# Patient Record
Sex: Male | Born: 1985 | Race: Black or African American | Hispanic: No | Marital: Single | Smoking: Current every day smoker
Health system: Southern US, Community
[De-identification: ages and names within clinical notes are randomized; demographics above are authoritative.]

## PROBLEM LIST (undated history)

## (undated) DIAGNOSIS — I10 Essential (primary) hypertension: Secondary | ICD-10-CM

---
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
## 2005-02-12 ENCOUNTER — Emergency Department (HOSPITAL_COMMUNITY): Admission: EM | Admit: 2005-02-12 | Discharge: 2005-02-13 | Payer: Self-pay | Admitting: *Deleted

## 2005-07-23 ENCOUNTER — Ambulatory Visit: Admission: RE | Admit: 2005-07-23 | Discharge: 2005-07-23 | Payer: Self-pay | Admitting: Pediatrics

## 2005-08-18 ENCOUNTER — Ambulatory Visit: Payer: Self-pay | Admitting: Pulmonary Disease

## 2005-11-11 ENCOUNTER — Emergency Department (HOSPITAL_COMMUNITY): Admission: EM | Admit: 2005-11-11 | Discharge: 2005-11-11 | Payer: Self-pay | Admitting: Emergency Medicine

## 2006-04-14 ENCOUNTER — Emergency Department (HOSPITAL_COMMUNITY): Admission: EM | Admit: 2006-04-14 | Discharge: 2006-04-14 | Payer: Self-pay | Admitting: Emergency Medicine

## 2007-09-04 ENCOUNTER — Emergency Department (HOSPITAL_COMMUNITY): Admission: EM | Admit: 2007-09-04 | Discharge: 2007-09-04 | Payer: Self-pay | Admitting: Emergency Medicine

## 2008-10-13 ENCOUNTER — Ambulatory Visit (HOSPITAL_COMMUNITY): Payer: Self-pay | Admitting: Psychiatry

## 2008-11-05 ENCOUNTER — Emergency Department (HOSPITAL_COMMUNITY): Admission: EM | Admit: 2008-11-05 | Discharge: 2008-11-05 | Payer: Self-pay | Admitting: Emergency Medicine

## 2008-11-17 ENCOUNTER — Ambulatory Visit (HOSPITAL_COMMUNITY): Payer: Self-pay | Admitting: Psychiatry

## 2009-01-12 ENCOUNTER — Ambulatory Visit (HOSPITAL_COMMUNITY): Payer: Self-pay | Admitting: Psychiatry

## 2009-02-09 ENCOUNTER — Ambulatory Visit (HOSPITAL_COMMUNITY): Payer: Self-pay | Admitting: Psychiatry

## 2009-03-30 ENCOUNTER — Ambulatory Visit (HOSPITAL_COMMUNITY): Payer: Self-pay | Admitting: Psychiatry

## 2009-05-27 ENCOUNTER — Ambulatory Visit (HOSPITAL_COMMUNITY): Payer: Self-pay | Admitting: Psychiatry

## 2009-05-27 ENCOUNTER — Emergency Department (HOSPITAL_COMMUNITY): Admission: EM | Admit: 2009-05-27 | Discharge: 2009-05-27 | Payer: Self-pay | Admitting: Emergency Medicine

## 2009-08-17 ENCOUNTER — Ambulatory Visit (HOSPITAL_COMMUNITY): Payer: Self-pay | Admitting: Psychiatry

## 2009-10-14 ENCOUNTER — Ambulatory Visit (HOSPITAL_COMMUNITY): Payer: Self-pay | Admitting: Psychiatry

## 2009-10-19 ENCOUNTER — Ambulatory Visit (HOSPITAL_COMMUNITY): Payer: Self-pay | Admitting: Psychology

## 2009-12-14 ENCOUNTER — Ambulatory Visit (HOSPITAL_COMMUNITY): Payer: Self-pay | Admitting: Psychiatry

## 2010-07-08 ENCOUNTER — Emergency Department (HOSPITAL_COMMUNITY): Admission: EM | Admit: 2010-07-08 | Discharge: 2010-07-08 | Payer: Self-pay | Admitting: Emergency Medicine

## 2010-10-25 LAB — BASIC METABOLIC PANEL
BUN: 16 mg/dL (ref 6–23)
CO2: 27 mEq/L (ref 19–32)
GFR calc Af Amer: >60 mL/min (ref >60)
GFR calc non Af Amer: >60 mL/min (ref >60)

## 2010-10-25 LAB — DIFFERENTIAL
Eosinophils Absolute: 0 10*3/uL (ref 0.0–0.7)
Lymphs Abs: 1.7 10*3/uL (ref 0.7–4.0)
Monocytes Absolute: 0.5 10*3/uL (ref 0.1–1.0)
Neutro Abs: 4.2 10*3/uL (ref 1.7–7.7)
Neutrophils Relative %: 66 % (ref 43–77)

## 2010-10-25 LAB — RAPID URINE DRUG SCREEN, HOSP PERFORMED
Barbiturates: NOT DETECTED
Benzodiazepines: NOT DETECTED
Cocaine: NOT DETECTED
Opiates: NOT DETECTED

## 2010-10-25 LAB — URINALYSIS, ROUTINE W REFLEX MICROSCOPIC
Bilirubin Urine: NEGATIVE
Glucose, UA: NEGATIVE mg/dL
Urobilinogen, UA: 0.2 mg/dL (ref 0.0–1.0)

## 2010-10-25 LAB — CBC
MCH: 32.5 pg (ref 26.0–34.0)
MCV: 92.7 fL (ref 78.0–100.0)
RBC: 4.55 MIL/uL (ref 4.22–5.81)
WBC: 6.4 10*3/uL (ref 4.0–10.5)

## 2010-10-25 LAB — URINE MICROSCOPIC-ADD ON

## 2010-10-25 LAB — ETHANOL: Alcohol, Ethyl (B): <5 mg/dL (ref 0–10)

## 2010-11-17 LAB — DIFFERENTIAL
Basophils Absolute: 0 10*3/uL (ref 0.0–0.1)
Basophils Relative: 0 % (ref 0–1)
Eosinophils Absolute: 0.1 10*3/uL (ref 0.0–0.7)
Eosinophils Relative: 2 % (ref 0–5)
Lymphocytes Relative: 41 % (ref 12–46)
Lymphs Abs: 2.1 10*3/uL (ref 0.7–4.0)
Monocytes Absolute: 0.5 10*3/uL (ref 0.1–1.0)
Monocytes Relative: 10 % (ref 3–12)
Neutro Abs: 2.4 10*3/uL (ref 1.7–7.7)
Neutrophils Relative %: 46 % (ref 43–77)

## 2010-11-17 LAB — CBC
HCT: 44.6 % (ref 39.0–52.0)
MCHC: 34.5 g/dL (ref 30.0–36.0)
MCV: 100.3 fL — ABNORMAL HIGH (ref 78.0–100.0)
WBC: 5.1 10*3/uL (ref 4.0–10.5)

## 2010-11-17 LAB — BASIC METABOLIC PANEL
Calcium: 8.9 mg/dL (ref 8.4–10.5)
Creatinine, Ser: 1.12 mg/dL (ref 0.4–1.5)

## 2010-12-30 NOTE — Procedures (Signed)
Robert Snyder, Robert Snyder               ACCOUNT NO.:  [id number]   MEDICAL RECORD NO.:  [id number]          PATIENT TYPE:  OUT   LOCATION:  SLEEP LAB                     FACILITY:  [facility]   PHYSICIAN:  Marcelyn Bruins, M.D. [facility] DATE OF BIRTH:  05/08/1986   DATE OF STUDY:  07/23/2005                              NOCTURNAL POLYSOMNOGRAM   REFERRING PHYSICIAN:  Dr. Vivia Ewing.   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   EPWORTH SLEEPINESS SCORE:  8   SLEEP ARCHITECTURE:  The patient had a total sleep time of 451 minutes with  decreased slow wave sleep and borderline quantities of REM.  Sleep onset  latency was normal and REM onset was at the upper limits of normal.  Sleep  efficiency was excellent at 94%.   RESPIRATORY DATA:  The patient was found to have 21 hypopneas and 5 apneas  for a respiratory disturbance index of 4 per hour.  The events were not  positional, but clearly were more prominent during REM.  The patient was  noted to have mild-to-moderate snoring with moderate numbers of nonspecific  arousals.   OXYGEN DATA:  The patient had O2 desaturation as low as 90% with a few of  his obstructive events.   CARDIAC DATA:  No clinically significant cardiac arrhythmias.   MOVEMENT/PARASOMNIA:  The patient was found to have small numbers of leg  jerks with no clinically significant sleep disruption.  The patient was  noted to have very mild episodes of bruxism.   IMPRESSION/RECOMMENDATION:  1.  Small numbers of obstructive events which do not meet the respiratory      disturbance index criteria for the obstructive sleep apnea syndrome.      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  This is a pre-sleep-apnea condition which can      result in sleep disruption.  Considerations for treatment would include      weight  loss if applicable, oral appliance, upper airway surgery, and on      occasion, continuous positive airway pressure.  2.  The patient complains of excessive daytime sleepiness; however, has a      fairly low Epworth score.  There is nothing on the current sleep study      which stands out as a prime suspect for his symptomatology.  Clinical      correlation is suggested to see whether the patient may have a daytime      sleep disorder such as narcolepsy were idiopathic hypersomnia.                                            ______________________________  Marcelyn Bruins, M.D. [facility]  Diplomate, American Board of Sleep  Medicine     KC/MEDQ  D:  08/18/2005 10:14:55  T:  08/18/2005 11:51:54  Job:  [id number]

## 2011-09-29 ENCOUNTER — Emergency Department (HOSPITAL_COMMUNITY)
Admission: EM | Admit: 2011-09-29 | Discharge: 2011-09-29 | Disposition: A | Discharge status: Home / Self Care | Payer: Self-pay | Attending: Emergency Medicine | Admitting: Emergency Medicine

## 2011-09-29 ENCOUNTER — Encounter (HOSPITAL_COMMUNITY): Payer: Self-pay | Admitting: *Deleted

## 2011-09-29 DIAGNOSIS — F17200 Nicotine dependence, unspecified, uncomplicated: Secondary | ICD-10-CM | POA: Insufficient documentation

## 2011-09-29 DIAGNOSIS — B86 Scabies: Secondary | ICD-10-CM | POA: Insufficient documentation

## 2011-09-29 MED ORDER — PERMETHRIN 5 % EX CREA: TOPICAL_CREAM | CUTANEOUS | Status: AC

## 2011-09-29 NOTE — ED Notes (Signed)
"  bumps keep poppig up on my hand and my cousin was dx with scabies when he come up here"

## 2011-09-29 NOTE — Discharge Instructions (Signed)
Scabies Scabies are small bugs (mites) that burrow under the skin and cause red bumps and severe itching. These bugs can only be seen with a microscope. Scabies are highly contagious. They can spread easily from person to person by direct contact. They are also spread through sharing clothing or linens that have the scabies mites living in them. It is not unusual for an entire family to become infected through shared towels, clothing, or bedding.  HOME CARE INSTRUCTIONS   Your caregiver may prescribe a cream or lotion to kill the mites. If this cream is prescribed; massage the cream into the entire area of the body from the neck to the bottom of both feet. Also massage the cream into the scalp and face if your child is less than 25 year old. Avoid the eyes and mouth.   Leave the cream on for 8 to12 hours. Do not wash your hands after application. Your child should bathe or shower after the 8 to 12 hour application period. Sometimes it is helpful to apply the cream to your child at right before bedtime.   One treatment is usually effective and will eliminate approximately 95% of infestations. For severe cases, your caregiver may decide to repeat the treatment in 1 week. Everyone in your household should be treated with one application of the cream.   New rashes or burrows should not appear after successful treatment within 24 to 48 hours; however the itching and rash may last for 2 to 4 weeks after successful treatment. If your symptoms persist longer than this, see your caregiver.   Your caregiver also may prescribe a medication to help with the itching or to help the rash go away more quickly.   Scabies can live on clothing or linens for up to 3 days. Your entire child's recently used clothing, towels, stuffed toys, and bed linens should be washed in hot water and then dried in a dryer for at least 20 minutes on high heat. Items that cannot be washed should be enclosed in a plastic bag for at least 3  days.   To help relieve itching, bathe your child in a cool bath or apply cool washcloths to the affected areas.   Your child may return to school after treatment with the prescribed cream.  SEEK MEDICAL CARE IF:   The itching persists longer than 4 weeks after treatment.   The rash spreads or becomes infected (the area has red blisters or yellow-tan crust).  Document Released: 07/31/2005 Document Revised: 04/12/2011 Document Reviewed: 12/09/2008 [facility] Patient Information 2012 [place], [place].  Use the cream as directed.  Repeat in 2 weeks if needed.

## 2011-09-29 NOTE — ED Provider Notes (Signed)
History     CSN: [id number]  Arrival date & time 09/29/11  1030   First MD Initiated Contact with Patient 09/29/11 1052      Chief Complaint  Patient presents with  . Rash    (Consider location/radiation/quality/duration/timing/severity/associated sxs/prior treatment) HPI Comments: Started on hands and wrists, spread to forearms and now on torso.  Girlfriend and another friend living in the same house also have similar sxs.  Patient is a 26 y.o. male presenting with rash. The history is provided by the patient. No language interpreter was used.  Rash  This is a new problem. Episode onset: ~ 2 weeks. The problem has been gradually worsening. The problem is associated with an unknown factor. Associated symptoms include itching. He has tried nothing for the symptoms.    History reviewed. No pertinent past medical history.  History reviewed. No pertinent past surgical history.  No family history on file.  History  Substance Use Topics  . Smoking status: Current Everyday Smoker  . Smokeless tobacco: Not on file  . Alcohol Use: Yes      Review of Systems  Skin: Positive for itching and rash.    Allergies  Penicillins  Home Medications  No current outpatient prescriptions on file.  BP 166/104  Pulse 78  Temp 98.5 F (36.9 C)  Resp 16  SpO2 100%  Physical Exam  Nursing note and vitals reviewed. Constitutional: He is oriented to person, place, and time. He appears well-developed and well-nourished.  HENT:  Head: Normocephalic and atraumatic.  Eyes: EOM are normal.  Neck: Normal range of motion.  Cardiovascular: Normal rate, regular rhythm, normal heart sounds and intact distal pulses.   Pulmonary/Chest: Effort normal and breath sounds normal. No respiratory distress.  Abdominal: Soft. He exhibits no distension. There is no tenderness.  Musculoskeletal: Normal range of motion.       Arms: Neurological: He is alert and oriented to person, place, and time.    Skin: Skin is warm and dry. Rash noted.  Psychiatric: He has a normal mood and affect. Judgment normal.    ED Course  Procedures (including critical care time)  Labs Reviewed - No data to display No results found.   No diagnosis found.    MDM          Worthy Rancher, PA 09/29/11 [phone]

## 2011-09-29 NOTE — ED Notes (Signed)
C/o rash.

## 2011-09-30 NOTE — ED Provider Notes (Signed)
Evaluation and management procedures were performed by the PA/NP under my supervision/collaboration.   Johanny Segers D Vicy Medico, MD 09/30/11 0820 

## 2012-02-14 ENCOUNTER — Emergency Department (HOSPITAL_COMMUNITY)
Admission: EM | Admit: 2012-02-14 | Discharge: 2012-02-14 | Disposition: A | Discharge status: Home / Self Care | Payer: Self-pay | Attending: Emergency Medicine | Admitting: Emergency Medicine

## 2012-02-14 ENCOUNTER — Emergency Department (HOSPITAL_COMMUNITY): Payer: Self-pay

## 2012-02-14 ENCOUNTER — Encounter (HOSPITAL_COMMUNITY): Payer: Self-pay

## 2012-02-14 DIAGNOSIS — R1013 Epigastric pain: Secondary | ICD-10-CM | POA: Insufficient documentation

## 2012-02-14 DIAGNOSIS — J40 Bronchitis, not specified as acute or chronic: Secondary | ICD-10-CM

## 2012-02-14 DIAGNOSIS — R112 Nausea with vomiting, unspecified: Secondary | ICD-10-CM | POA: Insufficient documentation

## 2012-02-14 DIAGNOSIS — R0602 Shortness of breath: Secondary | ICD-10-CM | POA: Insufficient documentation

## 2012-02-14 DIAGNOSIS — R51 Headache: Secondary | ICD-10-CM | POA: Insufficient documentation

## 2012-02-14 DIAGNOSIS — F17200 Nicotine dependence, unspecified, uncomplicated: Secondary | ICD-10-CM | POA: Insufficient documentation

## 2012-02-14 LAB — CBC WITH DIFFERENTIAL/PLATELET
Eosinophils Absolute: 0 10*3/uL (ref 0.0–0.7)
Eosinophils Relative: 0 % (ref 0–5)
Hemoglobin: 15.3 g/dL (ref 13.0–17.0)
Lymphocytes Relative: 14 % (ref 12–46)
MCH: 32.8 pg (ref 26.0–34.0)
MCHC: 34.9 g/dL (ref 30.0–36.0)
Monocytes Absolute: 0.7 10*3/uL (ref 0.1–1.0)
Monocytes Relative: 7 % (ref 3–12)
Neutro Abs: 8 10*3/uL — ABNORMAL HIGH (ref 1.7–7.7)
Platelets: 144 10*3/uL — ABNORMAL LOW (ref 150–400)

## 2012-02-14 LAB — BASIC METABOLIC PANEL
Calcium: 9.7 mg/dL (ref 8.4–10.5)
GFR calc non Af Amer: >90 mL/min (ref >90)
Glucose, Bld: 106 mg/dL — ABNORMAL HIGH (ref 70–99)

## 2012-02-14 MED ORDER — ONDANSETRON 8 MG PO TBDP: 8.0000 mg | ORAL_TABLET | Freq: Three times a day (TID) | ORAL | Status: AC | PRN

## 2012-02-14 MED ORDER — ALBUTEROL SULFATE HFA 108 (90 BASE) MCG/ACT IN AERS
2.0000 | INHALATION_SPRAY | RESPIRATORY_TRACT | Status: DC | PRN
  Administered 2012-02-14: 2 via RESPIRATORY_TRACT
  Filled 2012-02-14: qty 6.7

## 2012-02-14 MED ORDER — ONDANSETRON HCL 4 MG/2ML IJ SOLN
INTRAMUSCULAR | Status: AC
  Administered 2012-02-14: 4 mg via INTRAVENOUS
  Filled 2012-02-14: qty 2

## 2012-02-14 MED ORDER — SODIUM CHLORIDE 0.9 % IV BOLUS (SEPSIS)
2000.0000 mL | Freq: Once | INTRAVENOUS | Status: AC
  Administered 2012-02-14: 2000 mL via INTRAVENOUS

## 2012-02-14 MED ORDER — ONDANSETRON HCL 4 MG/2ML IJ SOLN
4.0000 mg | Freq: Once | INTRAMUSCULAR | Status: AC
  Administered 2012-02-14: 4 mg via INTRAVENOUS

## 2012-02-14 NOTE — ED Notes (Signed)
I have been vomiting all day, have a headache, my ribs hurts, and I am cold per pt.

## 2012-02-14 NOTE — ED Provider Notes (Signed)
History     CSN: [id number]  Arrival date & time 02/14/12  0112   First MD Initiated Contact with Patient 02/14/12 0200      Chief Complaint  Patient presents with  . Vomiting     (Consider location/radiation/quality/duration/timing/severity/associated sxs/prior treatment) HPI [name] who had nausea and vomiting most of the day yesterday. There was no associated diarrhea. He has had chills and shortness of breath with this. He describes a vomiting is moderate to severe but is now subsided. He is having left lower rib pain and superficial epigastric pain which he thinks may be due to the vomiting. He denies any deep abdominal pain. He was noted to have a low-grade fever on arrival. He has had chills. He also has a headache that is improving. He has not been able to eat or drink anything since his vomiting began. He states he has been coughing a lot. He has a history of childhood asthma.  History reviewed. No pertinent past medical history.  History reviewed. No pertinent past surgical history.  History reviewed. No pertinent family history.  History  Substance Use Topics  . Smoking status: Current Everyday Smoker  . Smokeless tobacco: Not on file  . Alcohol Use: Yes      Review of Systems  All other systems reviewed and are negative.    Allergies  Penicillins  Home Medications  No current outpatient prescriptions on file.  BP 137/79  Pulse 96  Temp 100.3 F (37.9 C) (Oral)  Resp 18  Ht 5\' 6"  (1.676 m)  Wt 168 lb (76.204 kg)  BMI 27.12 kg/m2  SpO2 100%  Physical Exam General: Well-developed, well-nourished male in no acute distress; appearance consistent with age of record HENT: normocephalic, atraumatic; dry mucous membranes Eyes: pupils equal round and reactive to light; extraocular muscles intact Neck: supple Heart: regular rate and rhythm Lungs: clear to auscultation bilaterally Chest: Mild left lower rib tenderness without  deformity or crepitus Abdomen: soft; nondistended; mild epigastric tenderness; no masses or hepatosplenomegaly; bowel sounds present Extremities: No deformity; full range of motion Neurologic: Awake, alert and oriented; motor function intact in all extremities and symmetric; no facial droop Skin: Warm and dry     ED Course  Procedures (including critical care time)     MDM   Nursing notes and vitals signs, including pulse oximetry, reviewed.  Summary of this visit's results, reviewed by myself:  Labs:  Results for orders placed during the hospital encounter of 02/14/12  CBC WITH DIFFERENTIAL      Component Value Range   WBC 10.1  4.0 - 10.5 K/uL   RBC 4.67  4.22 - 5.81 MIL/uL   Hemoglobin 15.3  13.0 - 17.0 g/dL   HCT 78.2  95.6 - 21.3 %   MCV 94.0  78.0 - 100.0 fL   MCH 32.8  26.0 - 34.0 pg   MCHC 34.9  30.0 - 36.0 g/dL   RDW 08.6  57.8 - 46.9 %   Platelets 144 (*) 150 - 400 K/uL   Neutrophils Relative 79 (*) 43 - 77 %   Neutro Abs 8.0 (*) 1.7 - 7.7 K/uL   Lymphocytes Relative 14  12 - 46 %   Lymphs Abs 1.4  0.7 - 4.0 K/uL   Monocytes Relative 7  3 - 12 %   Monocytes Absolute 0.7  0.1 - 1.0 K/uL   Eosinophils Relative 0  0 - 5 %   Eosinophils Absolute 0.0  0.0 -  0.7 K/uL   Basophils Relative 0  0 - 1 %   Basophils Absolute 0.0  0.0 - 0.1 K/uL  BASIC METABOLIC PANEL      Component Value Range   Sodium 136  135 - 145 mEq/L   Potassium 3.2 (*) 3.5 - 5.1 mEq/L   Chloride 99  96 - 112 mEq/L   CO2 24  19 - 32 mEq/L   Glucose, Bld 106 (*) 70 - 99 mg/dL   BUN 12  6 - 23 mg/dL   Creatinine, Ser 9.60  0.50 - 1.35 mg/dL   Calcium 9.7  8.4 - 45.4 mg/dL   GFR calc non Af Amer >90  >90 mL/min   GFR calc Af Amer >90  >90 mL/min    Imaging Studies: Dg Chest 2 View  02/14/2012  *RADIOLOGY REPORT*  Clinical Data: Shortness of breath  CHEST - 2 VIEW  Comparison: 09/04/2007  Findings: Bronchitic changes. 6 mm nodular opacity projecting over the right upper lung. Mild right  suprahilar fullness.  No pleural effusion or pneumothorax. Cardiac contour within normal limits.  No acute osseous finding.  IMPRESSION:  Bronchitic changes.  6 mm right upper lobe nodular opacity and right suprahilar fullness. This may be accentuated by the bronchitic changes however a developing pneumonia or hilar process such as adenopathy is not excluded.  Recommend a short-term radiograph follow-up.  Original Report Authenticated By: Waneta Martins, M.D.   3:25 AM Patient and his mother advised of chest x-ray findings and radiologist's recommendation that he have a followup chest x-ray. Patient's cough or shortness of breath are likely due to bronchitis associated with a viral illness this also caused his vomiting. Is consistent with the patient's chest x-ray. We will provide an inhaler.        Hanley Seamen, MD 02/14/12 [phone]

## 2012-04-28 ENCOUNTER — Emergency Department (HOSPITAL_COMMUNITY)
Admission: EM | Admit: 2012-04-28 | Discharge: 2012-04-28 | Disposition: A | Discharge status: Home / Self Care | Payer: Self-pay | Attending: Emergency Medicine | Admitting: Emergency Medicine

## 2012-04-28 ENCOUNTER — Encounter (HOSPITAL_COMMUNITY): Payer: Self-pay | Admitting: *Deleted

## 2012-04-28 DIAGNOSIS — R112 Nausea with vomiting, unspecified: Secondary | ICD-10-CM | POA: Insufficient documentation

## 2012-04-28 DIAGNOSIS — F17200 Nicotine dependence, unspecified, uncomplicated: Secondary | ICD-10-CM | POA: Insufficient documentation

## 2012-04-28 DIAGNOSIS — R51 Headache: Secondary | ICD-10-CM | POA: Insufficient documentation

## 2012-04-28 DIAGNOSIS — Z880 Allergy status to penicillin: Secondary | ICD-10-CM | POA: Insufficient documentation

## 2012-04-28 MED ORDER — ONDANSETRON 8 MG PO TBDP
8.0000 mg | ORAL_TABLET | Freq: Once | ORAL | Status: AC
  Administered 2012-04-28: 8 mg via ORAL
  Filled 2012-04-28: qty 1

## 2012-04-28 MED ORDER — IBUPROFEN 400 MG PO TABS
600.0000 mg | ORAL_TABLET | Freq: Once | ORAL | Status: AC
  Administered 2012-04-28: 600 mg via ORAL
  Filled 2012-04-28: qty 2

## 2012-04-28 MED ORDER — HYDROCODONE-ACETAMINOPHEN 5-325 MG PO TABS
1.0000 | ORAL_TABLET | Freq: Once | ORAL | Status: AC
  Administered 2012-04-28: 1 via ORAL
  Filled 2012-04-28: qty 1

## 2012-04-28 NOTE — ED Notes (Signed)
Headache since this morning and n/v

## 2012-04-28 NOTE — ED Provider Notes (Signed)
History     CSN: [id number]  Arrival date & time 04/28/12  1918   First MD Initiated Contact with Patient 04/28/12 1923      Chief Complaint  Patient presents with  . Headache  . Nausea  . Emesis    (Consider location/radiation/quality/duration/timing/severity/associated sxs/prior treatment) Patient is a 26 y.o. male presenting with headaches and vomiting. The history is provided by the patient.  Headache  Associated symptoms include vomiting. Pertinent negatives include no fever and no shortness of breath.  Emesis  Associated symptoms include headaches. Pertinent negatives include no abdominal pain, no chills, no cough and no fever.  pt states this morning had gradual onset of a mild frontal headache. States headache same as prior headaches. No acute or abrupt change or worsening of headache since onset. No neck pain or stiffness. No recent head injury, trauma or fall. No eye pain or change in vision. No change in speech. No numbness/weakness. No problems w balance or coordination. Onset was at rest, not w exertion or any special activity. No positional change to headache. No sinus drainage or congestion. No uri c/o. No fever or chills. This evening notes onset nv, 1-2 episodes emesis, color of recently ingested fluids, no bilious or bloody emesis. No abd pain or distension. No diarrhea or constipation. States has take not meds for headache since onset.       History reviewed. No pertinent past medical history.  History reviewed. No pertinent past surgical history.  History reviewed. No pertinent family history.  History  Substance Use Topics  . Smoking status: Current Every Day Smoker  . Smokeless tobacco: Not on file  . Alcohol Use: Yes      Review of Systems  Constitutional: Negative for fever and chills.  HENT: Negative for neck pain.   Eyes: Negative for pain, redness and visual disturbance.  Respiratory: Negative for cough and shortness of breath.     Cardiovascular: Negative for chest pain.  Gastrointestinal: Positive for vomiting. Negative for abdominal pain.  Genitourinary: Negative for flank pain.  Musculoskeletal: Negative for back pain.  Skin: Negative for rash.  Neurological: Positive for headaches. Negative for syncope, weakness and numbness.  Hematological: Does not bruise/bleed easily.  Psychiatric/Behavioral: Negative for confusion.    Allergies  Penicillins  Home Medications  No current outpatient prescriptions on file.  BP 146/91  Pulse 56  Temp 97.4 F (36.3 C) (Oral)  Resp 18  SpO2 100%  Physical Exam  Nursing note and vitals reviewed. Constitutional: He is oriented to person, place, and time. He appears well-developed and well-nourished. No distress.  HENT:  Head: Atraumatic.  Nose: Nose normal.  Mouth/Throat: Oropharynx is clear and moist.       No sinus or temporal tenderness  Eyes: Conjunctivae normal and EOM are normal. Pupils are equal, round, and reactive to light.  Neck: Neck supple. No tracheal deviation present.       No stiffness or rigidity  Cardiovascular: Normal rate, regular rhythm, normal heart sounds and intact distal pulses.   Pulmonary/Chest: Effort normal and breath sounds normal. No accessory muscle usage. No respiratory distress.  Abdominal: Soft. Bowel sounds are normal. He exhibits no distension and no mass. There is no tenderness. There is no rebound and no guarding.  Musculoskeletal: Normal range of motion. He exhibits no edema and no tenderness.  Neurological: He is alert and oriented to person, place, and time. No cranial nerve deficit.       Motor intact bil, no pronator drift, steady  gait.  Skin: Skin is warm and dry. He is not diaphoretic.  Psychiatric: He has a normal mood and affect.    ED Course  Procedures (including critical care time)     MDM  Motrin po. zofran po. vicodin 1 po.   Reviewed nursing notes and prior charts for additional history.   Reviewed  prior charts, prior neg head ct for c/o headache noted.   Recheck pt comfortable. No nv in ed.        Suzi Roots, MD 04/28/12 2039

## 2012-08-05 ENCOUNTER — Encounter (HOSPITAL_COMMUNITY): Payer: Self-pay | Admitting: *Deleted

## 2012-08-05 DIAGNOSIS — R197 Diarrhea, unspecified: Secondary | ICD-10-CM | POA: Insufficient documentation

## 2012-08-05 DIAGNOSIS — R0989 Other specified symptoms and signs involving the circulatory and respiratory systems: Secondary | ICD-10-CM | POA: Insufficient documentation

## 2012-08-05 DIAGNOSIS — B9789 Other viral agents as the cause of diseases classified elsewhere: Secondary | ICD-10-CM | POA: Insufficient documentation

## 2012-08-05 DIAGNOSIS — R05 Cough: Secondary | ICD-10-CM | POA: Insufficient documentation

## 2012-08-05 DIAGNOSIS — R059 Cough, unspecified: Secondary | ICD-10-CM | POA: Insufficient documentation

## 2012-08-05 DIAGNOSIS — J029 Acute pharyngitis, unspecified: Secondary | ICD-10-CM | POA: Insufficient documentation

## 2012-08-05 DIAGNOSIS — F17200 Nicotine dependence, unspecified, uncomplicated: Secondary | ICD-10-CM | POA: Insufficient documentation

## 2012-08-05 MED ORDER — ACETAMINOPHEN 500 MG PO TABS
ORAL_TABLET | ORAL | Status: AC
  Administered 2012-08-05: 1000 mg via NASAL
  Filled 2012-08-05: qty 2

## 2012-08-05 NOTE — ED Notes (Signed)
Fever, chills, cough, sore throat. No rash.

## 2012-08-06 ENCOUNTER — Emergency Department (HOSPITAL_COMMUNITY)
Admission: EM | Admit: 2012-08-06 | Discharge: 2012-08-06 | Disposition: A | Discharge status: Home / Self Care | Payer: Self-pay | Attending: Emergency Medicine | Admitting: Emergency Medicine

## 2012-08-06 ENCOUNTER — Emergency Department (HOSPITAL_COMMUNITY): Payer: Self-pay

## 2012-08-06 DIAGNOSIS — B349 Viral infection, unspecified: Secondary | ICD-10-CM

## 2012-08-06 MED ORDER — ALBUTEROL SULFATE HFA 108 (90 BASE) MCG/ACT IN AERS
2.0000 | INHALATION_SPRAY | RESPIRATORY_TRACT | Status: DC | PRN
  Administered 2012-08-06: 2 via RESPIRATORY_TRACT
  Filled 2012-08-06: qty 6.7

## 2012-08-06 MED ORDER — HYDROCOD POLST-CHLORPHEN POLST 10-8 MG/5ML PO LQCR
5.0000 mL | Freq: Once | ORAL | Status: AC
  Administered 2012-08-06: 5 mL via ORAL
  Filled 2012-08-06: qty 5

## 2012-08-06 MED ORDER — HYDROCOD POLST-CHLORPHEN POLST 10-8 MG/5ML PO LQCR: 5.0000 mL | Freq: Two times a day (BID) | ORAL | Status: DC | PRN

## 2012-08-06 NOTE — ED Notes (Signed)
Pt reports fever that started today. Pt states sides hurting from coughing. Pt states has been coughing up yellow stuff.

## 2012-08-06 NOTE — ED Provider Notes (Signed)
History     CSN: [id number]  Arrival date & time 08/05/12  2242   First MD Initiated Contact with Patient 08/06/12 0116      Chief Complaint  Patient presents with  . Fever    (Consider location/radiation/quality/duration/timing/severity/associated sxs/prior treatment) HPI This is a 26 year old male who had the onset of fever and chills yesterday evening. This fever has been as high as 102.6. It is associated with cough, chest congestion and scratchy throat. He denies nasal congestion or rhinorrhea. He denies body aches. Symptoms are moderate in severity. Fever was improved with Tylenol. He denies nausea or vomiting but has had diarrhea.  History reviewed. No pertinent past medical history.  History reviewed. No pertinent past surgical history.  History reviewed. No pertinent family history.  History  Substance Use Topics  . Smoking status: Current Every Day Smoker  . Smokeless tobacco: Not on file  . Alcohol Use: Yes      Review of Systems  All other systems reviewed and are negative.    Allergies  Penicillins  Home Medications  No current outpatient prescriptions on file.  BP 137/85  Pulse 87  Temp 98.6 F (37 C) (Oral)  Resp 20  Ht 5\' 5"  (1.651 m)  Wt 170 lb (77.111 kg)  BMI 28.29 kg/m2  SpO2 98%  Physical Exam General: Well-developed, well-nourished male in no acute distress; appearance consistent with age of record HENT: normocephalic, atraumatic; no pharyngeal erythema or exudate Eyes: pupils equal round and reactive to light; extraocular muscles intact Neck: supple Heart: regular rate and rhythm Lungs: decreased air movement without frank wheezing;  Abdomen: Soft; nondistended Extremities: No deformity; full range of motion Neurologic: Awake, alert and oriented; motor function intact in all extremities and symmetric; no facial droop Skin: Warm and dry Psychiatric: Normal mood and affect    ED Course  Procedures (including critical care  time)    MDM  Nursing notes and vitals signs, including pulse oximetry, reviewed.  Summary of this visit's results, reviewed by myself:  Labs:  No results found for this or any previous visit (from the past 24 hour(s)).  Imaging Studies: Dg Chest 2 View  08/06/2012  *RADIOLOGY REPORT*  Clinical Data: Fever, cough, shortness of breath  CHEST - 2 VIEW  Comparison: 02/14/2012  Findings: Lungs are clear. No pleural effusion or pneumothorax.  Cardiomediastinal silhouette is within normal limits.  Visualized osseous structures are within normal limits.  IMPRESSION: No evidence of acute cardiopulmonary disease.   Original Report Authenticated By: Charline Bills, M.D.             Hanley Seamen, MD 08/06/12 [phone]

## 2013-05-21 ENCOUNTER — Emergency Department (HOSPITAL_COMMUNITY)
Admission: EM | Admit: 2013-05-21 | Discharge: 2013-05-21 | Disposition: A | Discharge status: Home / Self Care | Payer: Self-pay | Attending: Emergency Medicine | Admitting: Emergency Medicine

## 2013-05-21 ENCOUNTER — Encounter (HOSPITAL_COMMUNITY): Payer: Self-pay | Admitting: Emergency Medicine

## 2013-05-21 DIAGNOSIS — F17200 Nicotine dependence, unspecified, uncomplicated: Secondary | ICD-10-CM | POA: Insufficient documentation

## 2013-05-21 DIAGNOSIS — R112 Nausea with vomiting, unspecified: Secondary | ICD-10-CM | POA: Insufficient documentation

## 2013-05-21 DIAGNOSIS — R110 Nausea: Secondary | ICD-10-CM

## 2013-05-21 DIAGNOSIS — Z880 Allergy status to penicillin: Secondary | ICD-10-CM | POA: Insufficient documentation

## 2013-05-21 LAB — COMPREHENSIVE METABOLIC PANEL
Albumin: 4.2 g/dL (ref 3.5–5.2)
BUN: 10 mg/dL (ref 6–23)
CO2: 25 mEq/L (ref 19–32)
Chloride: 103 mEq/L (ref 96–112)
Creatinine, Ser: 1.08 mg/dL (ref 0.50–1.35)
GFR calc Af Amer: >90 mL/min (ref >90)
GFR calc non Af Amer: >90 mL/min (ref >90)
Glucose, Bld: 99 mg/dL (ref 70–99)
Total Bilirubin: 0.3 mg/dL (ref 0.3–1.2)

## 2013-05-21 LAB — CBC WITH DIFFERENTIAL/PLATELET
Basophils Relative: 0 % (ref 0–1)
HCT: 41.4 % (ref 39.0–52.0)
Hemoglobin: 14.5 g/dL (ref 13.0–17.0)
Lymphs Abs: 1.3 10*3/uL (ref 0.7–4.0)
MCHC: 35 g/dL (ref 30.0–36.0)
Monocytes Absolute: 0.3 10*3/uL (ref 0.1–1.0)
Monocytes Relative: 7 % (ref 3–12)
Neutro Abs: 2.1 10*3/uL (ref 1.7–7.7)

## 2013-05-21 MED ORDER — PROMETHAZINE HCL 25 MG PO TABS: 25.0000 mg | ORAL_TABLET | Freq: Four times a day (QID) | ORAL | Status: DC | PRN

## 2013-05-21 MED ORDER — RANITIDINE HCL 150 MG PO CAPS: 150.0000 mg | ORAL_CAPSULE | Freq: Two times a day (BID) | ORAL | Status: DC

## 2013-05-21 MED ORDER — ONDANSETRON 4 MG PO TBDP
4.0000 mg | ORAL_TABLET | Freq: Once | ORAL | Status: AC
  Administered 2013-05-21: 4 mg via ORAL
  Filled 2013-05-21: qty 1

## 2013-05-21 NOTE — ED Notes (Signed)
Pt co N/V x2 days, denies abdominal pain, afebrile. Last emesis 30 minutes prior to arrival.

## 2013-05-21 NOTE — Progress Notes (Signed)
ED/CM noted patient did not have health insurance and/or PCP listed in the computer.  Patient was given the [place] resource handout with information on the clinics, food pantries, and the handout for new health insurance sign-up.  Patient expressed appreciation for this. 

## 2013-05-21 NOTE — ED Provider Notes (Signed)
CSN: [id number]     Arrival date & time 05/21/13  1142 History  This chart was scribed for Benny Lennert, MD by Quintella Reichert, ED scribe.  This patient was seen in room APA19/APA19 and the patient's care was started at 12:17 PM.   Chief Complaint  Patient presents with  . Nausea  . Emesis    Patient is a 27 y.o. male presenting with vomiting. The history is provided by the patient. No language interpreter was used.  Emesis Severity:  Moderate Duration:  2 hours Timing:  Intermittent Chronicity:  New Relieved by: Rest. Exacerbated by: Movement. Ineffective treatments:  None tried Associated symptoms: no abdominal pain, no chills, no diarrhea and no headaches     HPI Comments: HOLDEN MANISCALCO is a 27 y.o. male with no chronic medical conditions who presents to the Emergency Department complaining of 2 days of nausea and emesis.  Pt states that he feels fine at rest but when he moves he becomes nauseated and vomits.  He has not taken any medications pta.  He denies fever, chills, diarrhea, abdominal pain, or any other associated symptoms.   History reviewed. No pertinent past medical history.  History reviewed. No pertinent past surgical history.  History reviewed. No pertinent family history.   History  Substance Use Topics  . Smoking status: Current Every Day Smoker  . Smokeless tobacco: Not on file  . Alcohol Use: Yes     Review of Systems  Constitutional: Negative for fever, chills, appetite change and fatigue.  HENT: Negative for congestion, ear discharge and sinus pressure.   Eyes: Negative for discharge.  Respiratory: Negative for cough.   Cardiovascular: Negative for chest pain.  Gastrointestinal: Positive for nausea and vomiting. Negative for abdominal pain and diarrhea.  Genitourinary: Negative for frequency and hematuria.  Musculoskeletal: Negative for back pain.  Skin: Negative for rash.  Neurological: Negative for seizures and headaches.   Psychiatric/Behavioral: Negative for hallucinations.     Allergies  Penicillins  Home Medications   Current Outpatient Rx  Name  Route  Sig  Dispense  Refill  . chlorpheniramine-HYDROcodone (TUSSIONEX PENNKINETIC ER) 10-8 MG/5ML LQCR   Oral   Take 5 mLs by mouth every 12 (twelve) hours as needed (for cough).   115 mL   0    BP 143/103  Pulse 71  Temp(Src) 98.8 F (37.1 C) (Oral)  Resp 17  Ht 5\' 6"  (1.676 m)  Wt 170 lb (77.111 kg)  BMI 27.45 kg/m2  SpO2 100%  Physical Exam  Constitutional: He is oriented to person, place, and time. He appears well-developed.  HENT:  Head: Normocephalic.  Eyes: Conjunctivae and EOM are normal. No scleral icterus.  Neck: Neck supple. No thyromegaly present.  Cardiovascular: Normal rate and regular rhythm.  Exam reveals no gallop and no friction rub.   No murmur heard. Pulmonary/Chest: No stridor. He has no wheezes. He has no rales. He exhibits no tenderness.  Abdominal: Soft. He exhibits no distension. There is tenderness. There is no rebound and no guarding.  Minimal epigastric tenderness  Musculoskeletal: Normal range of motion. He exhibits no edema.  Lymphadenopathy:    He has no cervical adenopathy.  Neurological: He is oriented to person, place, and time. He exhibits normal muscle tone. Coordination normal.  Skin: No rash noted. No erythema.  Psychiatric: He has a normal mood and affect. His behavior is normal.    ED Course  Procedures (including critical care time)  DIAGNOSTIC STUDIES: Oxygen Saturation is 100%  on room air, normal by my interpretation.    COORDINATION OF CARE: 12:20 PM-Discussed treatment plan which includes anti-emetics and labs with pt at bedside and pt agreed to plan.    Labs Review Labs Reviewed  CBC WITH DIFFERENTIAL - Abnormal; Notable for the following:    WBC 3.7 (*)    Platelets 143 (*)    All other components within normal limits  COMPREHENSIVE METABOLIC PANEL    Imaging Review No  results found.  MDM  No diagnosis found.     The chart was scribed for me under my direct supervision.  I personally performed the history, physical, and medical decision making and all procedures in the evaluation of this patient.Benny Lennert, MD 05/21/13 [phone]

## 2014-12-10 ENCOUNTER — Emergency Department (HOSPITAL_COMMUNITY): Payer: Self-pay

## 2014-12-10 ENCOUNTER — Emergency Department (HOSPITAL_COMMUNITY)
Admission: EM | Admit: 2014-12-10 | Discharge: 2014-12-10 | Disposition: A | Discharge status: Home / Self Care | Payer: Self-pay | Attending: Emergency Medicine | Admitting: Emergency Medicine

## 2014-12-10 ENCOUNTER — Encounter (HOSPITAL_COMMUNITY): Payer: Self-pay

## 2014-12-10 DIAGNOSIS — M778 Other enthesopathies, not elsewhere classified: Secondary | ICD-10-CM | POA: Insufficient documentation

## 2014-12-10 DIAGNOSIS — Z720 Tobacco use: Secondary | ICD-10-CM | POA: Insufficient documentation

## 2014-12-10 DIAGNOSIS — Z79899 Other long term (current) drug therapy: Secondary | ICD-10-CM | POA: Insufficient documentation

## 2014-12-10 DIAGNOSIS — Z880 Allergy status to penicillin: Secondary | ICD-10-CM | POA: Insufficient documentation

## 2014-12-10 MED ORDER — DICLOFENAC SODIUM 50 MG PO TBEC: 50.0000 mg | DELAYED_RELEASE_TABLET | Freq: Two times a day (BID) | ORAL | Status: DC

## 2014-12-10 MED ORDER — IBUPROFEN 800 MG PO TABS
800.0000 mg | ORAL_TABLET | Freq: Once | ORAL | Status: AC
  Administered 2014-12-10: 800 mg via ORAL
  Filled 2014-12-10: qty 1

## 2014-12-10 NOTE — ED Notes (Signed)
Pt c/o intermittent numbness in r hand and forearm for the past 3 days.  Denies injury.  Denies head, neck , or back pain.

## 2014-12-10 NOTE — ED Provider Notes (Signed)
CSN: [id number]     Arrival date & time 12/10/14  1739 History   First MD Initiated Contact with Patient 12/10/14 1804     Chief Complaint  Patient presents with  . intermittent r hand numbness      (Consider location/radiation/quality/duration/timing/severity/associated sxs/prior Treatment) HPI Robert Snyder is a 29 y.o. male who presents to the ED with right wrist pain that started 3 days ago. He reports that he does repetitive work with his hands. He describes the pain as starting in his middle and ring fingers and going to his wrist and to the forearm. He describes the pain as stinging and burning. He denies any other problems.    History reviewed. No pertinent past medical history. History reviewed. No pertinent past surgical history. No family history on file. History  Substance Use Topics  . Smoking status: Current Every Day Smoker  . Smokeless tobacco: Not on file  . Alcohol Use: Yes     Comment: occ    Review of Systems Negative except as stated in HPI   Allergies  Penicillins  Home Medications   Prior to Admission medications   Medication Sig Start Date End Date Taking? Authorizing Provider  diphenhydrAMINE (BENADRYL) 25 MG tablet Take 25 mg by mouth every 6 (six) hours as needed.   Yes Historical Provider, MD  diclofenac (VOLTAREN) 50 MG EC tablet Take 1 tablet (50 mg total) by mouth 2 (two) times daily. 12/10/14   Hope Orlene Och, NP  promethazine (PHENERGAN) 25 MG tablet Take 1 tablet (25 mg total) by mouth every 6 (six) hours as needed for nausea. Patient not taking: Reported on 12/10/2014 05/21/13   Bethann Berkshire, MD  ranitidine (ZANTAC) 150 MG capsule Take 1 capsule (150 mg total) by mouth 2 (two) times daily. Patient not taking: Reported on 12/10/2014 05/21/13   Bethann Berkshire, MD   BP 132/65 mmHg  Pulse 71  Temp(Src) 98.7 F (37.1 C)  Resp 20  Ht  (1.702 m)  Wt 180 lb (81.647 kg)  BMI 28.19 kg/m2  SpO2 100% Physical Exam  Constitutional: He is  oriented to person, place, and time. He appears well-developed and well-nourished. No distress.  HENT:  Head: Normocephalic and atraumatic.  Eyes: EOM are normal.  Neck: Neck supple.  Cardiovascular: Normal rate.   Pulmonary/Chest: Effort normal.  Musculoskeletal:       Right wrist: He exhibits tenderness. He exhibits normal range of motion, no crepitus, no deformity and no laceration. Swelling: minimal.  Radial pulses 2+ bilateral, adequate circulation, good touch sensation. Grips equal bilateral. Pain is on the palmar aspect.   Neurological: He is alert and oriented to person, place, and time. No cranial nerve deficit.  Skin: Skin is warm and dry.  Psychiatric: He has a normal mood and affect. His behavior is normal.  Nursing note and vitals reviewed.   ED Course  Procedures (including critical care time) Labs Review Dg Wrist Complete Right  12/10/2014   CLINICAL DATA:  29 year old male with intermittent pain and numbness in the right wrist radiating into the forearm for the past 3 days. No known injury.  EXAM: RIGHT WRIST - COMPLETE 3+ VIEW  COMPARISON:  None.  FINDINGS: There is no evidence of fracture or dislocation. There is no evidence of arthropathy or other focal bone abnormality. Soft tissues are unremarkable.  IMPRESSION: Negative.   Electronically Signed   By: Malachy Moan M.D.   On: 12/10/2014 18:42     MDM  28 y.o.  male with pain and burning that radiates from his middle and ring fingers to the wrist and forearm. Stable for d/c without neurovascular compromise. Will treat with NSAIDS, wrist splint applied, follow up with ortho. Discussed with the patient and all questioned fully answered. He will return if any problems arise.  Final diagnoses:  Tendonitis of wrist, right     [facility] Heightsope M Neese, NP 12/10/14 1859  Azalia BilisKevin Campos, MD 12/10/14 1902

## 2014-12-10 NOTE — Discharge Instructions (Signed)
Take the medication as directed and call Dr.Harrison's office for follow up. Wear the splint to help stabilize the area and decrease the pain.

## 2014-12-11 ENCOUNTER — Telehealth: Payer: Self-pay | Admitting: Orthopedic Surgery

## 2014-12-11 NOTE — Telephone Encounter (Signed)
Patient called to inquire about appointment following Emergency Room visit yesterday, 12/10/14, [facility], for problem of wrist/forearm pain; had Xray done there.  States he has insurance through his job, however, did not have the card at the time of the call; offered appointment upon receiving insurance information, as may require referral from primary care.  His ph# is [phone].

## 2014-12-23 NOTE — Telephone Encounter (Signed)
No further response to offer of appointment.

## 2014-12-27 ENCOUNTER — Encounter (HOSPITAL_COMMUNITY): Payer: Self-pay | Admitting: Emergency Medicine

## 2014-12-27 ENCOUNTER — Emergency Department (HOSPITAL_COMMUNITY): Payer: Self-pay

## 2014-12-27 ENCOUNTER — Emergency Department (HOSPITAL_COMMUNITY)
Admission: EM | Admit: 2014-12-27 | Discharge: 2014-12-27 | Disposition: A | Discharge status: Home / Self Care | Payer: Self-pay | Attending: Emergency Medicine | Admitting: Emergency Medicine

## 2014-12-27 DIAGNOSIS — Z791 Long term (current) use of non-steroidal anti-inflammatories (NSAID): Secondary | ICD-10-CM | POA: Insufficient documentation

## 2014-12-27 DIAGNOSIS — S63601A Unspecified sprain of right thumb, initial encounter: Secondary | ICD-10-CM | POA: Insufficient documentation

## 2014-12-27 DIAGNOSIS — Z880 Allergy status to penicillin: Secondary | ICD-10-CM | POA: Insufficient documentation

## 2014-12-27 DIAGNOSIS — Y9389 Activity, other specified: Secondary | ICD-10-CM | POA: Insufficient documentation

## 2014-12-27 DIAGNOSIS — W208XXA Other cause of strike by thrown, projected or falling object, initial encounter: Secondary | ICD-10-CM | POA: Insufficient documentation

## 2014-12-27 DIAGNOSIS — Z720 Tobacco use: Secondary | ICD-10-CM | POA: Insufficient documentation

## 2014-12-27 DIAGNOSIS — Y998 Other external cause status: Secondary | ICD-10-CM | POA: Insufficient documentation

## 2014-12-27 DIAGNOSIS — Z79899 Other long term (current) drug therapy: Secondary | ICD-10-CM | POA: Insufficient documentation

## 2014-12-27 DIAGNOSIS — Y9289 Other specified places as the place of occurrence of the external cause: Secondary | ICD-10-CM | POA: Insufficient documentation

## 2014-12-27 MED ORDER — HYDROCODONE-ACETAMINOPHEN 5-325 MG PO TABS: ORAL_TABLET | ORAL | Status: DC

## 2014-12-27 MED ORDER — IBUPROFEN 800 MG PO TABS
800.0000 mg | ORAL_TABLET | Freq: Once | ORAL | Status: AC
  Administered 2014-12-27: 800 mg via ORAL
  Filled 2014-12-27: qty 1

## 2014-12-27 MED ORDER — IBUPROFEN 800 MG PO TABS: 800.0000 mg | ORAL_TABLET | Freq: Three times a day (TID) | ORAL | Status: DC

## 2014-12-27 MED ORDER — OXYCODONE-ACETAMINOPHEN 5-325 MG PO TABS
1.0000 | ORAL_TABLET | Freq: Once | ORAL | Status: AC
  Administered 2014-12-27: 1 via ORAL
  Filled 2014-12-27: qty 1

## 2014-12-27 NOTE — ED Notes (Signed)
Pt given discharge instructions, patient signed out using E-signature, this nurse noticed after patient left that it did not save.

## 2014-12-27 NOTE — ED Provider Notes (Signed)
CSN: [id number]     Arrival date & time 12/27/14  1310 History  This chart was scribed for non-physician practitioner Pauline Ausammy Marieclaire Bettenhausen, PA, working with Glynn OctaveStephen Rancour, MD, by Tanda RockersMargaux Venter, ED Scribe. This patient was seen in room APFT24/APFT24 and the patient's care was started at 1:41 PM.    Chief Complaint  Patient presents with  . Hand Injury   Patient is a 29 y.o. male presenting with hand injury. The history is provided by the patient. No language interpreter was used.  Hand Injury Location:  Finger Injury: yes   Mechanism of injury: crush   Crush injury:    Mechanism:  Falling object Finger location:  R thumb Pain details:    Radiates to:  Does not radiate   Severity:  Mild   Duration:  1 day   Timing:  Constant   Progression:  Unchanged Chronicity:  New Ineffective treatments:  Ice Associated symptoms: no back pain and no fever      HPI Comments: Robert Snyder is a 29 y.o. male who presents to the Emergency Department complaining of right thumb pain that occurred 1 day ago. Pt reports that he was picking up a grill when he dropped it. Pt tried to catch the grill before it hit the ground and caught his right thumb between the grill and the ground, causing the pain. He notes increased swelling to the hand. Pt has been applying ice without relief. He has not taken any pain medication. He notes previous injury to the right hand but denies surgeries to the hand. Denies numbness or weakness, wrist pain or any other symptoms.    History reviewed. No pertinent past medical history. History reviewed. No pertinent past surgical history. History reviewed. No pertinent family history. History  Substance Use Topics  . Smoking status: Current Every Day Smoker -- 1.00 packs/day    Types: Cigarettes  . Smokeless tobacco: Not on file  . Alcohol Use: Yes     Comment: occ    Review of Systems  Constitutional: Negative for fever and chills.  HENT: Negative for congestion and  rhinorrhea.   Eyes: Negative for pain and visual disturbance.  Respiratory: Negative for cough and shortness of breath.   Cardiovascular: Negative for chest pain.  Gastrointestinal: Negative for nausea, vomiting and abdominal pain.  Musculoskeletal: Positive for arthralgias (Right thumb pain with swelling. ). Negative for back pain.  Skin: Negative for rash.  Neurological: Negative for dizziness, weakness, numbness and headaches.  Psychiatric/Behavioral: Negative for confusion.      Allergies  Penicillins  Home Medications   Prior to Admission medications   Medication Sig Start Date End Date Taking? Authorizing Provider  vitamin C (ASCORBIC ACID) 500 MG tablet Take 500 mg by mouth daily.   Yes Historical Provider, MD  diclofenac (VOLTAREN) 50 MG EC tablet Take 1 tablet (50 mg total) by mouth 2 (two) times daily. Patient not taking: Reported on 12/27/2014 12/10/14   Janne NapoleonHope M Neese, NP  diphenhydrAMINE (BENADRYL) 25 MG tablet Take 25 mg by mouth every 6 (six) hours as needed.    Historical Provider, MD  promethazine (PHENERGAN) 25 MG tablet Take 1 tablet (25 mg total) by mouth every 6 (six) hours as needed for nausea. Patient not taking: Reported on 12/10/2014 05/21/13   Bethann BerkshireJoseph Zammit, MD  ranitidine (ZANTAC) 150 MG capsule Take 1 capsule (150 mg total) by mouth 2 (two) times daily. Patient not taking: Reported on 12/10/2014 05/21/13   Bethann BerkshireJoseph Zammit, MD   Triage  Vitals: BP 151/93 mmHg  Pulse 70  Temp(Src) 98.7 F (37.1 C) (Oral)  Resp 18  Ht 5\' 6"  (1.676 m)  Wt 176 lb (79.833 kg)  BMI 28.42 kg/m2  SpO2 100%   Physical Exam  Constitutional: He is oriented to person, place, and time. He appears well-developed and well-nourished. No distress.  HENT:  Head: Normocephalic and atraumatic.  Eyes: Conjunctivae and EOM are normal.  Neck: Neck supple. No tracheal deviation present.  Cardiovascular: Normal rate, regular rhythm and normal heart sounds.   Pulmonary/Chest: Effort normal and  breath sounds normal. No respiratory distress.  Musculoskeletal: Normal range of motion.  Moderate soft tissue swelling and tenderness to the proximal right thumb. Pain with palpation. Wrist is non tender. Cap refill < 2 seconds. Radial pulses brisk. No bony deformity.  Compartments are soft  Neurological: He is alert and oriented to person, place, and time.  Skin: Skin is warm and dry.  Psychiatric: He has a normal mood and affect. His behavior is normal.  Nursing note and vitals reviewed.   ED Course  Procedures (including critical care time)  DIAGNOSTIC STUDIES: Oxygen Saturation is 100% on RA, normal by my interpretation.    COORDINATION OF CARE: 1:45 PM-Discussed treatment plan which includes DG R Hand with pt at bedside and pt agreed to plan.   Labs Review Labs Reviewed - No data to display  Imaging Review Dg Hand Complete Right  12/27/2014   CLINICAL DATA:  Right hand pain and swelling after grill fell on it last night. Initial encounter.  EXAM: RIGHT HAND - COMPLETE 3+ VIEW  COMPARISON:  None.  FINDINGS: There is no evidence of fracture or dislocation. There is no evidence of arthropathy or other focal bone abnormality. Soft tissues are unremarkable.  IMPRESSION: Normal right hand.   Electronically Signed   By: Lupita RaiderJames  Green Jr, M.D.   On: 12/27/2014 14:49     EKG Interpretation None      MDM   Final diagnoses:  Thumb sprain, right, initial encounter   velcro thumb spica applied, pain improved, remains NV intact.  XR neg for fx. Pt agrees to symptomatic tx and close f/u with ortho.  Advised of possible tendon injury and importance of close f/u.   I personally performed the services described in this documentation, which was scribed in my presence. The recorded information has been reviewed and is accurate.      Rosey Bathammy Shela Esses, PA-C 12/29/14 2108  Glynn OctaveStephen Rancour, MD 12/30/14 1021

## 2014-12-27 NOTE — ED Notes (Signed)
PT stated he was picking up a grill yesterday evening and dropped it onto his right hand. PT c/o swelling and decreased ROM to right thumb.

## 2014-12-31 ENCOUNTER — Ambulatory Visit: Payer: Self-pay | Admitting: Orthopedic Surgery

## 2015-08-10 ENCOUNTER — Emergency Department (HOSPITAL_COMMUNITY)
Admission: EM | Admit: 2015-08-10 | Discharge: 2015-08-10 | Disposition: A | Discharge status: Home / Self Care | Payer: Self-pay | Attending: Emergency Medicine | Admitting: Emergency Medicine

## 2015-08-10 ENCOUNTER — Emergency Department (HOSPITAL_COMMUNITY): Payer: Self-pay

## 2015-08-10 ENCOUNTER — Encounter (HOSPITAL_COMMUNITY): Payer: Self-pay | Admitting: *Deleted

## 2015-08-10 DIAGNOSIS — Y9389 Activity, other specified: Secondary | ICD-10-CM | POA: Insufficient documentation

## 2015-08-10 DIAGNOSIS — Y990 Civilian activity done for income or pay: Secondary | ICD-10-CM | POA: Insufficient documentation

## 2015-08-10 DIAGNOSIS — F17210 Nicotine dependence, cigarettes, uncomplicated: Secondary | ICD-10-CM | POA: Insufficient documentation

## 2015-08-10 DIAGNOSIS — Z79899 Other long term (current) drug therapy: Secondary | ICD-10-CM | POA: Insufficient documentation

## 2015-08-10 DIAGNOSIS — X501XXA Overexertion from prolonged static or awkward postures, initial encounter: Secondary | ICD-10-CM | POA: Insufficient documentation

## 2015-08-10 DIAGNOSIS — S63501A Unspecified sprain of right wrist, initial encounter: Secondary | ICD-10-CM | POA: Insufficient documentation

## 2015-08-10 DIAGNOSIS — Z880 Allergy status to penicillin: Secondary | ICD-10-CM | POA: Insufficient documentation

## 2015-08-10 DIAGNOSIS — Y9289 Other specified places as the place of occurrence of the external cause: Secondary | ICD-10-CM | POA: Insufficient documentation

## 2015-08-10 MED ORDER — IBUPROFEN 800 MG PO TABS: 800.0000 mg | ORAL_TABLET | Freq: Three times a day (TID) | ORAL | Status: DC

## 2015-08-10 MED ORDER — HYDROCODONE-ACETAMINOPHEN 5-325 MG PO TABS: ORAL_TABLET | ORAL | Status: DC

## 2015-08-10 NOTE — ED Notes (Signed)
Pt c/o right wrist pain x 1 week. Reports heavy lifting at work, but denies known injury.

## 2015-08-13 NOTE — ED Provider Notes (Signed)
CSN: [id number]     Arrival date & time 08/10/15  1022 History   First MD Initiated Contact with Patient 08/10/15 1102     Chief Complaint  Patient presents with  . Wrist Pain     (Consider location/radiation/quality/duration/timing/severity/associated sxs/prior Treatment) HPI   Robert Snyder is a 29 y.o. male who presents to the Emergency Department complaining of right wrist pain for one week.  He states the pain is worse with movement.  He states that he lifts heavy objects at work and performs repetitive movements.  He denies known injury, swelling, redness, numbness or weakness.    History reviewed. No pertinent past medical history. History reviewed. No pertinent past surgical history. History reviewed. No pertinent family history. Social History  Substance Use Topics  . Smoking status: Current Every Day Smoker -- 1.00 packs/day    Types: Cigarettes  . Smokeless tobacco: None  . Alcohol Use: Yes     Comment: occ    Review of Systems  Constitutional: Negative for fever and chills.  Musculoskeletal: Positive for joint swelling and arthralgias.  Skin: Negative for color change and wound.  Neurological: Negative for weakness and numbness.  All other systems reviewed and are negative.     Allergies  Penicillins  Home Medications   Prior to Admission medications   Medication Sig Start Date End Date Taking? Authorizing Provider  diphenhydrAMINE (BENADRYL) 25 MG tablet Take 25 mg by mouth every 6 (six) hours as needed.    Historical Provider, MD  HYDROcodone-acetaminophen (NORCO/VICODIN) 5-325 MG tablet Take one-two tabs po q 4-6 hrs prn pain 08/10/15   Rosaleen Mazer, PA-C  ibuprofen (ADVIL,MOTRIN) 800 MG tablet Take 1 tablet (800 mg total) by mouth 3 (three) times daily. 08/10/15   Zay Yeargan, PA-C  vitamin C (ASCORBIC ACID) 500 MG tablet Take 500 mg by mouth daily.    Historical Provider, MD   BP 136/83 mmHg  Pulse 80  Temp(Src) 98.2 F (36.8 C) (Oral)   Resp 14  Ht 5\' 6"  (1.676 m)  Wt 79.379 kg  BMI 28.26 kg/m2  SpO2 100% Physical Exam  Constitutional: He is oriented to person, place, and time. He appears well-developed and well-nourished. No distress.  HENT:  Head: Normocephalic and atraumatic.  Cardiovascular: Normal rate, regular rhythm and intact distal pulses.   Pulmonary/Chest: Effort normal and breath sounds normal. No respiratory distress.  Musculoskeletal: He exhibits tenderness. He exhibits no edema.    ttp and edema of distal right wrist.  Radial pulse is brisk, distal sensation intact.  CR< 2 sec.  No bruising or bony deformity.  Patient has full ROM. Compartments soft.  Neurological: He is alert and oriented to person, place, and time. He exhibits normal muscle tone. Coordination normal.  Skin: Skin is warm and dry.  Nursing note and vitals reviewed.   ED Course  Procedures (including critical care time) Labs Review Labs Reviewed - No data to display  Imaging Review Dg Wrist Complete Right  08/10/2015  CLINICAL DATA:  RIGHT wrist pain for 1 week, does heavy lifting at work, no specific injury EXAM: RIGHT WRIST - COMPLETE 3+ VIEW COMPARISON:  RIGHT hand radiographs 12/27/2014, RIGHT wrist radiographs 12/10/2014 FINDINGS: Osseous mineralization normal. Joint spaces preserved. No fracture, dislocation, or bone destruction. IMPRESSION: Normal exam. Electronically Signed   By: Ulyses SouthwardMark  Boles M.D.   On: 08/10/2015 11:25    I have personally reviewed and evaluated these images and lab results as part of my medical decision-making.   EKG  Interpretation None      MDM   Final diagnoses:  Wrist sprain, right, initial encounter    NV intact.  XR neg for fx.  Likely sprain.  No concerning sx's for septic joint.  Pt agrees to symptomatic tx and close ortho f/u if not improving.  Wrist splint applied , pain improved    Pauline Aus, PA-C 08/13/15 2320  Glynn Octave, MD 08/14/15 [phone]

## 2016-04-05 ENCOUNTER — Emergency Department (HOSPITAL_COMMUNITY): Payer: BLUE CROSS/BLUE SHIELD

## 2016-04-05 ENCOUNTER — Emergency Department (HOSPITAL_COMMUNITY)
Admission: EM | Admit: 2016-04-05 | Discharge: 2016-04-05 | Disposition: A | Discharge status: Home / Self Care | Payer: BLUE CROSS/BLUE SHIELD | Attending: Emergency Medicine | Admitting: Emergency Medicine

## 2016-04-05 ENCOUNTER — Encounter (HOSPITAL_COMMUNITY): Payer: Self-pay | Admitting: Emergency Medicine

## 2016-04-05 DIAGNOSIS — Y939 Activity, unspecified: Secondary | ICD-10-CM | POA: Diagnosis not present

## 2016-04-05 DIAGNOSIS — H5711 Ocular pain, right eye: Secondary | ICD-10-CM

## 2016-04-05 DIAGNOSIS — Y929 Unspecified place or not applicable: Secondary | ICD-10-CM | POA: Diagnosis not present

## 2016-04-05 DIAGNOSIS — Y990 Civilian activity done for income or pay: Secondary | ICD-10-CM | POA: Insufficient documentation

## 2016-04-05 DIAGNOSIS — W228XXA Striking against or struck by other objects, initial encounter: Secondary | ICD-10-CM | POA: Diagnosis not present

## 2016-04-05 DIAGNOSIS — F17210 Nicotine dependence, cigarettes, uncomplicated: Secondary | ICD-10-CM | POA: Insufficient documentation

## 2016-04-05 DIAGNOSIS — I10 Essential (primary) hypertension: Secondary | ICD-10-CM | POA: Diagnosis not present

## 2016-04-05 DIAGNOSIS — S0083XA Contusion of other part of head, initial encounter: Secondary | ICD-10-CM | POA: Insufficient documentation

## 2016-04-05 DIAGNOSIS — Z23 Encounter for immunization: Secondary | ICD-10-CM | POA: Diagnosis not present

## 2016-04-05 DIAGNOSIS — S0993XA Unspecified injury of face, initial encounter: Secondary | ICD-10-CM | POA: Diagnosis present

## 2016-04-05 HISTORY — DX: Essential (primary) hypertension: I10

## 2016-04-05 MED ORDER — KETOROLAC TROMETHAMINE 0.5 % OP SOLN: 1.0000 [drp] | Freq: Four times a day (QID) | OPHTHALMIC | 0 refills | Status: DC

## 2016-04-05 MED ORDER — FLUORESCEIN SODIUM 1 MG OP STRP
1.0000 | ORAL_STRIP | Freq: Once | OPHTHALMIC | Status: AC
  Administered 2016-04-05: 1 via OPHTHALMIC
  Filled 2016-04-05: qty 1

## 2016-04-05 MED ORDER — HYDROCODONE-ACETAMINOPHEN 5-325 MG PO TABS
1.0000 | ORAL_TABLET | Freq: Once | ORAL | Status: AC
  Administered 2016-04-05: 1 via ORAL
  Filled 2016-04-05: qty 1

## 2016-04-05 MED ORDER — IBUPROFEN 800 MG PO TABS: 800.0000 mg | ORAL_TABLET | Freq: Three times a day (TID) | ORAL | 0 refills | Status: DC

## 2016-04-05 MED ORDER — TETRACAINE HCL 0.5 % OP SOLN
2.0000 [drp] | Freq: Once | OPHTHALMIC | Status: AC
  Administered 2016-04-05: 2 [drp] via OPHTHALMIC
  Filled 2016-04-05: qty 4

## 2016-04-05 MED ORDER — TETANUS-DIPHTH-ACELL PERTUSSIS 5-2.5-18.5 LF-MCG/0.5 IM SUSP
0.5000 mL | Freq: Once | INTRAMUSCULAR | Status: AC
  Administered 2016-04-05: 0.5 mL via INTRAMUSCULAR
  Filled 2016-04-05: qty 0.5

## 2016-04-05 NOTE — ED Provider Notes (Signed)
[facility]-EMERGENCY DEPT Provider Note   CSN: [id number] Arrival date & time: 04/05/16  1127     History   Chief Complaint Chief Complaint  Patient presents with  . Eye Pain    HPI Robert BoringDaquan J Stairs is a 30 y.o. male.  HPI   Robert Snyder is a 30 y.o. male who presents to the Emergency Department complaining of right eye pain and facial pain since 2:30 pm Monday.  He states that he was pulling on a piece of plastic when it flew back striking him in the right face just below his right eye.  He reports tenderness along his right face and a small abrasion below his eye.  He has  Intermittent blurred vision in the right eye.  He denies neck pain, dizziness, vomiting, LOC.     Past Medical History:  Diagnosis Date  . Hypertension     There are no active problems to display for this patient.   History reviewed. No pertinent surgical history.     Home Medications    Prior to Admission medications   Medication Sig Start Date End Date Taking? Authorizing Provider  diphenhydrAMINE (BENADRYL) 25 MG tablet Take 25 mg by mouth every 6 (six) hours as needed for itching or allergies.     Historical Provider, MD  HYDROcodone-acetaminophen (NORCO/VICODIN) 5-325 MG tablet Take one-two tabs po q 4-6 hrs prn pain Patient not taking: Reported on 04/05/2016 08/10/15   Frandy Basnett, PA-C  ibuprofen (ADVIL,MOTRIN) 800 MG tablet Take 1 tablet (800 mg total) by mouth 3 (three) times daily. Patient not taking: Reported on 04/05/2016 08/10/15   Pauline Ausammy Jahzir Strohmeier, PA-C    Family History History reviewed. No pertinent family history.  Social History Social History  Substance Use Topics  . Smoking status: Current Every Day Smoker    Packs/day: 1.00    Types: Cigarettes  . Smokeless tobacco: Never Used  . Alcohol use Yes     Comment: occ     Allergies   Penicillins   Review of Systems Review of Systems  Constitutional: Negative for activity change, appetite change and fever.  HENT:  Negative for congestion, facial swelling and trouble swallowing.        Right facial tenderness and abrasion below right eye  Eyes: Positive for visual disturbance. Negative for pain, discharge and redness.  Respiratory: Negative for chest tightness and shortness of breath.   Gastrointestinal: Negative for abdominal pain, nausea and vomiting.  Musculoskeletal: Negative for arthralgias and neck pain.  Skin: Negative for color change.  Neurological: Negative for dizziness, syncope, facial asymmetry, speech difficulty, weakness, numbness and headaches.  Hematological: Negative for adenopathy.  Psychiatric/Behavioral: Negative for confusion.     Physical Exam Updated Vital Signs BP 145/100 (BP Location: Right Arm) Comment: bp x 2 with hypertension  Pulse 68   Temp 98.2 F (36.8 C) (Oral)   Resp 17   Ht 5\' 6"  (1.676 m)   Wt 78.9 kg   SpO2 100%   BMI 28.08 kg/m   Physical Exam  Constitutional: He is oriented to person, place, and time. He appears well-developed and well-nourished. No distress.  HENT:  Head:    Nose: No sinus tenderness. No epistaxis.  Mouth/Throat: Uvula is midline and oropharynx is clear and moist. Normal dentition.  Superficial scratch below right eye.  No bleeding, edema. No bony tenderness.    Eyes: Conjunctivae and EOM are normal. Pupils are equal, round, and reactive to light. Lids are everted and swept, no foreign bodies  found. No foreign body present in the right eye. Right conjunctiva is not injected. Right conjunctiva has no hemorrhage.  Neck: Normal range of motion. Neck supple.  Cardiovascular: Normal rate and regular rhythm.   Pulmonary/Chest: Effort normal. No respiratory distress.  Musculoskeletal: Normal range of motion.  Neurological: He is alert and oriented to person, place, and time.  Skin: Skin is warm and dry.  Psychiatric: He has a normal mood and affect.  Nursing note and vitals reviewed.    ED Treatments / Results  Labs (all labs  ordered are listed, but only abnormal results are displayed) Labs Reviewed - No data to display  EKG  EKG Interpretation None       Radiology No results found.  Procedures Procedures (including critical care time)     Visual Acuity  Right Eye Distance:   Left Eye Distance:   Bilateral Distance:    Right Eye Near: R Near: 20/25 with glassess Left Eye Near:  L Near: 20/20 with glassess Bilateral Near:  20/15 with glasses  Medications Ordered in ED Medications - No data to display   Initial Impression / Assessment and Plan / ED Course  I have reviewed the triage vital signs and the nursing notes.  Pertinent labs & imaging results that were available during my care of the patient were reviewed by me and considered in my medical decision making (see chart for details).  Clinical Course    Pt will superficial abrasion to right face.  No edema or bony tenderness of the face.  No injuries of the right eye on exam.    doubt facial fx or intercranial injury. NV intact. Steady gait.    Pt remains hypertensive on vitals recheck.  Advised of importance of close PMD recheck.  He is asymptomatic except for facial injury.    Appears stable for d/c and return precautions given  Final Clinical Impressions(s) / ED Diagnoses   Final diagnoses:  Eye pain, right  Facial contusion, initial encounter  Essential hypertension    New Prescriptions New Prescriptions   No medications on file     Rosey Bathammy Sharlett Lienemann, PA-C 04/07/16 2150    Marily MemosJason Mesner, MD 04/08/16 0800

## 2016-04-05 NOTE — ED Notes (Signed)
PA aware of BP

## 2016-04-05 NOTE — Discharge Instructions (Signed)
Return here in 1-2 days if the symptoms are not improving.
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
## 2016-04-05 NOTE — ED Notes (Signed)
HTN education given

## 2017-10-05 ENCOUNTER — Emergency Department (HOSPITAL_COMMUNITY)
Admission: EM | Admit: 2017-10-05 | Discharge: 2017-10-05 | Disposition: A | Discharge status: Home / Self Care | Payer: BLUE CROSS/BLUE SHIELD | Attending: Emergency Medicine | Admitting: Emergency Medicine

## 2017-10-05 NOTE — ED Triage Notes (Signed)
Not in waiting room when called.

## 2019-03-27 ENCOUNTER — Emergency Department (HOSPITAL_COMMUNITY)
Admission: EM | Admit: 2019-03-27 | Discharge: 2019-03-28 | Disposition: A | Discharge status: Home / Self Care | Payer: Self-pay | Attending: Emergency Medicine | Admitting: Emergency Medicine

## 2019-03-27 ENCOUNTER — Other Ambulatory Visit: Payer: Self-pay

## 2019-03-27 ENCOUNTER — Emergency Department (HOSPITAL_COMMUNITY): Payer: Self-pay

## 2019-03-27 ENCOUNTER — Encounter (HOSPITAL_COMMUNITY): Payer: Self-pay

## 2019-03-27 DIAGNOSIS — W230XXA Caught, crushed, jammed, or pinched between moving objects, initial encounter: Secondary | ICD-10-CM | POA: Insufficient documentation

## 2019-03-27 DIAGNOSIS — S62141A Displaced fracture of body of hamate [unciform] bone, right wrist, initial encounter for closed fracture: Secondary | ICD-10-CM | POA: Insufficient documentation

## 2019-03-27 DIAGNOSIS — Y9389 Activity, other specified: Secondary | ICD-10-CM | POA: Insufficient documentation

## 2019-03-27 DIAGNOSIS — Y999 Unspecified external cause status: Secondary | ICD-10-CM | POA: Insufficient documentation

## 2019-03-27 DIAGNOSIS — Y92814 Boat as the place of occurrence of the external cause: Secondary | ICD-10-CM | POA: Insufficient documentation

## 2019-03-27 DIAGNOSIS — Z23 Encounter for immunization: Secondary | ICD-10-CM | POA: Insufficient documentation

## 2019-03-27 DIAGNOSIS — F17210 Nicotine dependence, cigarettes, uncomplicated: Secondary | ICD-10-CM | POA: Insufficient documentation

## 2019-03-27 DIAGNOSIS — Z79899 Other long term (current) drug therapy: Secondary | ICD-10-CM | POA: Insufficient documentation
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
## 2019-03-28 MED ORDER — TETANUS-DIPHTH-ACELL PERTUSSIS 5-2.5-18.5 LF-MCG/0.5 IM SUSP
0.5000 mL | Freq: Once | INTRAMUSCULAR | Status: AC
  Administered 2019-03-28: 01:00:00 0.5 mL via INTRAMUSCULAR

## 2019-03-28 MED ORDER — HYDROCODONE-ACETAMINOPHEN 5-325 MG PO TABS: 2.0000 | ORAL_TABLET | ORAL | 0 refills | Status: DC | PRN

## 2019-03-28 MED ORDER — ACETAMINOPHEN 325 MG PO TABS
650.0000 mg | ORAL_TABLET | Freq: Once | ORAL | Status: AC
  Administered 2019-03-28: 01:00:00 650 mg via ORAL
  Filled 2019-03-28: qty 2

## 2019-03-28 MED ORDER — IBUPROFEN 800 MG PO TABS
800.0000 mg | ORAL_TABLET | Freq: Once | ORAL | Status: AC
  Administered 2019-03-28: 01:00:00 800 mg via ORAL
  Filled 2019-03-28: qty 1

## 2019-03-28 MED ORDER — BACITRACIN-NEOMYCIN-POLYMYXIN 400-5-5000 EX OINT
TOPICAL_OINTMENT | Freq: Once | CUTANEOUS | Status: AC
  Administered 2019-03-28: 1 via TOPICAL
  Filled 2019-03-28: qty 1

## 2019-03-28 NOTE — Discharge Instructions (Addendum)
You have what appears to be a tiny chip fracture of 1 of the bones in your wrist called the hamate bone.  Please keep your splint clean and dry.  Please call Dr. Apolonio Schneiders for hand surgery evaluation as soon as possible.  Use 1000 mg of Tylenol every 4-6 hours, or 600 mg of ibuprofen with each meal and at bedtime for mild pain.  Use Norco for more severe pain.  Norco may cause drowsiness, please do not drive a vehicle, operate machinery, or participate in activities requiring concentration when taking this medication.  Please keep your hand elevated above your heart is much as possible, as this will decrease swelling and pain.
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
## 2019-03-31 MED FILL — Hydrocodone-Acetaminophen Tab 5-325 MG: ORAL | Qty: 6 | Status: AC

## 2020-09-04 ENCOUNTER — Other Ambulatory Visit: Payer: Self-pay

## 2020-09-04 ENCOUNTER — Emergency Department (HOSPITAL_COMMUNITY)
Admission: EM | Admit: 2020-09-04 | Discharge: 2020-09-04 | Disposition: A | Discharge status: Home / Self Care | Payer: BLUE CROSS/BLUE SHIELD | Attending: Emergency Medicine | Admitting: Emergency Medicine

## 2020-09-04 ENCOUNTER — Encounter (HOSPITAL_COMMUNITY): Payer: Self-pay

## 2020-09-04 DIAGNOSIS — F17210 Nicotine dependence, cigarettes, uncomplicated: Secondary | ICD-10-CM | POA: Insufficient documentation

## 2020-09-04 DIAGNOSIS — Z20822 Contact with and (suspected) exposure to covid-19: Secondary | ICD-10-CM | POA: Diagnosis not present

## 2020-09-04 DIAGNOSIS — I10 Essential (primary) hypertension: Secondary | ICD-10-CM | POA: Diagnosis not present

## 2020-09-04 DIAGNOSIS — J029 Acute pharyngitis, unspecified: Secondary | ICD-10-CM | POA: Diagnosis not present

## 2020-09-04 DIAGNOSIS — R070 Pain in throat: Secondary | ICD-10-CM | POA: Diagnosis present

## 2020-09-04 DIAGNOSIS — Z1152 Encounter for screening for COVID-19: Secondary | ICD-10-CM

## 2020-09-04 LAB — SARS CORONAVIRUS 2 (TAT 6-24 HRS): SARS Coronavirus 2: NEGATIVE

## 2020-09-04 LAB — GROUP A STREP BY PCR: Group A Strep by PCR: NOT DETECTED

## 2020-09-04 MED ORDER — MAGIC MOUTHWASH W/LIDOCAINE: 5.0000 mL | Freq: Three times a day (TID) | ORAL | 0 refills | Status: DC | PRN

## 2020-09-04 MED ORDER — HYDROCHLOROTHIAZIDE 25 MG PO TABS: 25.0000 mg | ORAL_TABLET | Freq: Every day | ORAL | 0 refills | Status: DC

## 2020-09-04 NOTE — Discharge Instructions (Signed)
Your strep test today was negative.  Your COVID test is still pending.  Your results should be back within 24 hours.  You may review your results on MyChart or someone from the hospital will contact you if your results are positive.  In the meantime, you need to isolate at home.  If your results are positive, you will need to isolate at home for 5 days.  On day 5 if you are without fever and are feeling better you may return to normal activities.  On day 5 if you are still feeling bad or continue to have a fever you will need to isolate for 10 days.  Return to the emergency department for any worsening symptoms.  Your blood pressure today was elevated.  I have prescribed a blood pressure medicine for you to start.  Your blood pressure will need to be rechecked by primary care provider in 2 weeks.  I have provided resource information for you to establish primary care.

## 2020-09-04 NOTE — ED Triage Notes (Signed)
Pt presents to ED via [facility], feels like something is stuck in his throat. Pt states his throat is sore. Pt states started last night. Pt denies cough or fever.

## 2020-09-04 NOTE — ED Provider Notes (Signed)
[facility] EMERGENCY DEPARTMENT Provider Note   CSN: [id number] Arrival date & time: 09/04/20  1638     History Chief Complaint  Patient presents with  . Sore Throat    Robert Snyder is a 35 y.o. male.  HPI      Robert Snyder is a 36 y.o. male with past medical history of hypertension who is never taken medication for his hypertension. He presents to the Emergency Department complaining of sore throat upon waking this morning.  He describes feeling a foreign body sensation of his throat and has been spitting up thick mucus with streaks of blood.  He denies nasal congestion, body aches, fever or chills, cough, abdominal pain, or shortness of breath.  No known COVID exposures no other household members are ill.  He is not vaccinated for COVID-19.  He has not taken any medications this morning for symptomatic relief.  He has been tolerating liquids.    Past Medical History:  Diagnosis Date  . Hypertension     There are no problems to display for this patient.   History reviewed. No pertinent surgical history.     No family history on file.  Social History   Tobacco Use  . Smoking status: Current Every Day Smoker    Packs/day: 1.00    Types: Cigarettes  . Smokeless tobacco: Never Used  Substance Use Topics  . Alcohol use: Yes    Comment: occ  . Drug use: Yes    Types: Marijuana    Comment: occ    Home Medications Prior to Admission medications   Medication Sig Start Date End Date Taking? Authorizing Provider  diphenhydrAMINE (BENADRYL) 25 MG tablet Take 25 mg by mouth every 6 (six) hours as needed for itching or allergies.     [provider]  HYDROcodone-acetaminophen (NORCO/VICODIN) 5-325 MG tablet Take 2 tablets by mouth every 4 (four) hours as needed. 03/28/19   Ivery Quale, PA-C  ibuprofen (ADVIL,MOTRIN) 800 MG tablet Take 1 tablet (800 mg total) by mouth 3 (three) times daily. 04/05/16   Triplett, Tammy, PA-C  ketorolac (ACULAR) 0.5 %  ophthalmic solution Place 1 drop into both eyes every 6 (six) hours. 04/05/16   Triplett, Tammy, PA-C    Allergies    Penicillins  Review of Systems   Review of Systems  Constitutional: Negative for activity change, appetite change, chills and fever.  HENT: Positive for sore throat. Negative for congestion, ear pain, facial swelling, rhinorrhea, trouble swallowing and voice change.   Eyes: Negative for pain and visual disturbance.  Respiratory: Negative for cough and shortness of breath.   Cardiovascular: Negative for chest pain.  Gastrointestinal: Negative for abdominal pain, diarrhea, nausea and vomiting.  Musculoskeletal: Negative for arthralgias, neck pain and neck stiffness.  Skin: Negative for color change and rash.  Neurological: Negative for dizziness, facial asymmetry, speech difficulty, numbness and headaches.  Hematological: Negative for adenopathy.    Physical Exam Updated Vital Signs BP (!) 169/119 (BP Location: Right Arm)   Pulse 70   Temp 97.9 F (36.6 C) (Oral)   Resp 18   Ht 5\' 6"  (1.676 m)   Wt 79.4 kg   SpO2 99%   BMI 28.25 kg/m   Physical Exam Vitals and nursing note reviewed.  Constitutional:      General: He is not in acute distress.    Appearance: He is well-developed. He is not ill-appearing or toxic-appearing.  HENT:     Right Ear: Tympanic membrane and ear canal  normal.     Left Ear: Tympanic membrane and ear canal normal.     Mouth/Throat:     Mouth: Mucous membranes are moist. No oral lesions.     Pharynx: Uvula midline. Posterior oropharyngeal erythema present. No pharyngeal swelling, oropharyngeal exudate or uvula swelling.     Tonsils: No tonsillar exudate or tonsillar abscesses.     Comments: Diffuse erythema of the oropharynx.  No vesicles or exudates.  No tonsillar edema.  Uvula is midline and nonedematous. No bulging of the soft palette.  Cardiovascular:     Rate and Rhythm: Normal rate and regular rhythm.     Pulses: Normal pulses.   Pulmonary:     Effort: Pulmonary effort is normal.     Breath sounds: Normal breath sounds.  Abdominal:     Palpations: Abdomen is soft.     Tenderness: There is no abdominal tenderness.  Musculoskeletal:        General: Normal range of motion.     Cervical back: Normal range of motion. No rigidity.  Lymphadenopathy:     Cervical: No cervical adenopathy.  Skin:    General: Skin is warm.     Capillary Refill: Capillary refill takes less than 2 seconds.     Findings: No rash.  Neurological:     Mental Status: He is alert.     Sensory: No sensory deficit.     Motor: No weakness.      ED Results / Procedures / Treatments   Labs (all labs ordered are listed, but only abnormal results are displayed) Labs Reviewed  GROUP A STREP BY PCR  SARS CORONAVIRUS 2 (TAT 6-24 HRS)    EKG None  Radiology No results found.  Procedures Procedures (including critical care time)  Medications Ordered in ED Medications - No data to display  ED Course  I have reviewed the triage vital signs and the nursing notes.  Pertinent labs & imaging results that were available during my care of the patient were reviewed by me and considered in my medical decision making (see chart for details).    MDM Rules/Calculators/A&P                          Patient here with onset of sore throat upon waking this morning.  Pain associated with swallowing.  No fever, rhinorrhea or cough.  He is well-appearing.  Handling his secretions well and drinking soda without difficulty. Clinical suspicion for peritonsillar or retropharyngeal abscess is low.  Possible early covid.   Strep screen negative, COVID test pending.  Patient is hypertensive here.  He has a history of hypertension but states he has never taken medications for it.  No associated symptoms at this time.  We will start him on antihypertensive medications and provide resource information for primary care.  He agrees to covid isolation instructions and  return precautions discussed.  Robert Snyder was evaluated in Emergency Department on 09/04/2020 for the symptoms described in the history of present illness. He was evaluated in the context of the global COVID-19 pandemic, which necessitated consideration that the patient might be at risk for infection with the SARS-CoV-2 virus that causes COVID-19. Institutional protocols and algorithms that pertain to the evaluation of patients at risk for COVID-19 are in a state of rapid change based on information released by regulatory bodies including the CDC and federal and state organizations. These policies and algorithms were followed during the patient's care in the ED.  Final Clinical Impression(s) / ED Diagnoses Final diagnoses:  Pharyngitis, unspecified etiology  Hypertension, unspecified type  Encounter for screening for COVID-19    Rx / DC Orders ED Discharge Orders    None       Pauline Aus, PA-C 09/04/20 9179    Pricilla Loveless, MD 09/04/20 1003

## 2020-11-22 ENCOUNTER — Emergency Department (HOSPITAL_COMMUNITY)
Admission: EM | Admit: 2020-11-22 | Discharge: 2020-11-22 | Disposition: A | Discharge status: Home / Self Care | Payer: BLUE CROSS/BLUE SHIELD | Attending: Emergency Medicine | Admitting: Emergency Medicine

## 2020-11-22 ENCOUNTER — Other Ambulatory Visit: Payer: Self-pay

## 2020-11-22 ENCOUNTER — Emergency Department (HOSPITAL_COMMUNITY): Payer: BLUE CROSS/BLUE SHIELD

## 2020-11-22 ENCOUNTER — Encounter (HOSPITAL_COMMUNITY): Payer: Self-pay | Admitting: Emergency Medicine

## 2020-11-22 DIAGNOSIS — M25562 Pain in left knee: Secondary | ICD-10-CM | POA: Insufficient documentation

## 2020-11-22 DIAGNOSIS — Y92410 Unspecified street and highway as the place of occurrence of the external cause: Secondary | ICD-10-CM | POA: Insufficient documentation

## 2020-11-22 DIAGNOSIS — F17210 Nicotine dependence, cigarettes, uncomplicated: Secondary | ICD-10-CM | POA: Diagnosis not present

## 2020-11-22 DIAGNOSIS — Z79899 Other long term (current) drug therapy: Secondary | ICD-10-CM | POA: Diagnosis not present

## 2020-11-22 DIAGNOSIS — I10 Essential (primary) hypertension: Secondary | ICD-10-CM | POA: Diagnosis not present

## 2020-11-22 MED ORDER — IBUPROFEN 600 MG PO TABS: 600.0000 mg | ORAL_TABLET | Freq: Four times a day (QID) | ORAL | 0 refills | Status: DC | PRN

## 2020-11-22 MED ORDER — METHOCARBAMOL 500 MG PO TABS: 500.0000 mg | ORAL_TABLET | Freq: Two times a day (BID) | ORAL | 0 refills | Status: DC
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
## 2020-11-22 NOTE — Discharge Instructions (Signed)
Your x-rays are negative.  However, do not exclude tendinous or ligamentous injury.  I encourage you to follow-up with orthopedist, Dr. Dallas Schimke, for ongoing evaluation and management.  In the interim, please rest and elevate your affected leg.  I recommend ibuprofen 600 mg every 6 hours as needed for pain control.  You were given a prescription for Robaxin which is a muscle relaxer.  You should not drive, work, consume alcohol, or operate machinery while taking this medication as it can make you very drowsy.    Return to the ED or seek immediate medical attention should you experience any new or worsening symptoms.
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
## 2020-11-24 ENCOUNTER — Telehealth: Payer: Self-pay | Admitting: Orthopaedic Surgery
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
## 2020-11-24 NOTE — Telephone Encounter (Signed)
Received call back from HR manager at [employer], ph# [phone] / fax# [phone]; faxed request for Worker's comp claim information; appointment pending. Patient aware.

## 2020-11-24 NOTE — Telephone Encounter (Signed)
Called patient; voiced understanding. Aware if we do not have worker's comp verification with all information, we will need to hold on the appointment until this information has been verified and received.
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
## 2020-11-30 ENCOUNTER — Other Ambulatory Visit: Payer: Self-pay

## 2020-11-30 ENCOUNTER — Encounter: Payer: Self-pay | Admitting: Orthopaedic Surgery

## 2020-11-30 ENCOUNTER — Ambulatory Visit (INDEPENDENT_AMBULATORY_CARE_PROVIDER_SITE_OTHER): Payer: PRIVATE HEALTH INSURANCE | Admitting: Orthopaedic Surgery

## 2020-11-30 VITALS — BP 172/109 | HR 82 | Ht 66.0 in | Wt 190.0 lb

## 2020-11-30 DIAGNOSIS — F17210 Nicotine dependence, cigarettes, uncomplicated: Secondary | ICD-10-CM | POA: Diagnosis not present

## 2020-11-30 DIAGNOSIS — M25562 Pain in left knee: Secondary | ICD-10-CM

## 2020-11-30 NOTE — Patient Instructions (Addendum)
Use Aspercreme, Biofreeze or Voltaren gel over the counter 2-3 times daily make sure you rub it in well each time you use it.  Light duty at work until follow up in 3 weeks to review MRI limited standing and walking  Steps to Quit Smoking Smoking tobacco is the leading cause of preventable death. It can affect almost every organ in the body. Smoking puts you and people around you at risk for many serious, long-lasting (chronic) diseases. Quitting smoking can be hard, but it is one of the best things that you can do for your health. It is never too late to quit. How do I get ready to quit? When you decide to quit smoking, make a plan to help you succeed. Before you quit:  Pick a date to quit. Set a date within the next 2 weeks to give you time to prepare.  Write down the reasons why you are quitting. Keep this list in places where you will see it often.  Tell your family, friends, and co-workers that you are quitting. Their support is important.  Talk with your doctor about the choices that may help you quit.  Find out if your health insurance will pay for these treatments.  Know the people, places, things, and activities that make you want to smoke (triggers). Avoid them. What first steps can I take to quit smoking?  Throw away all cigarettes at home, at work, and in your car.  Throw away the things that you use when you smoke, such as ashtrays and lighters.  Clean your car. Make sure to empty the ashtray.  Clean your home, including curtains and carpets. What can I do to help me quit smoking? Talk with your doctor about taking medicines and seeing a counselor at the same time. You are more likely to succeed when you do both.  If you are pregnant or breastfeeding, talk with your doctor about counseling or other ways to quit smoking. Do not take medicine to help you quit smoking unless your doctor tells you to do so. To quit smoking: Quit right away  Quit smoking totally, instead of  slowly cutting back on how much you smoke over a period of time.  Go to counseling. You are more likely to quit if you go to counseling sessions regularly. Take medicine You may take medicines to help you quit. Some medicines need a prescription, and some you can buy over-the-counter. Some medicines may contain a drug called nicotine to replace the nicotine in cigarettes. Medicines may:  Help you to stop having the desire to smoke (cravings).  Help to stop the problems that come when you stop smoking (withdrawal symptoms). Your doctor may ask you to use:  Nicotine patches, gum, or lozenges.  Nicotine inhalers or sprays.  Non-nicotine medicine that is taken by mouth. Find resources Find resources and other ways to help you quit smoking and remain smoke-free after you quit. These resources are most helpful when you use them often. They include:  Online chats with a Veterinary surgeon.  Phone quitlines.  Printed Materials engineer.  Support groups or group counseling.  Text messaging programs.  Mobile phone apps. Use apps on your mobile phone or tablet that can help you stick to your quit plan. There are many free apps for mobile phones and tablets as well as websites. Examples include Quit Guide from the [employer] and smokefree.gov   What things can I do to make it easier to quit?  Talk to your family and  friends. Ask them to support and encourage you.  Call a phone quitline (1-800-QUIT-NOW), reach out to support groups, or work with a Veterinary surgeon.  Ask people who smoke to not smoke around you.  Avoid places that make you want to smoke, such as: ? Bars. ? Parties. ? Smoke-break areas at work.  Spend time with people who do not smoke.  Lower the stress in your life. Stress can make you want to smoke. Try these things to help your stress: ? Getting regular exercise. ? Doing deep-breathing exercises. ? Doing yoga. ? Meditating. ? Doing a body scan. To do this, close your eyes, focus on  one area of your body at a time from head to toe. Notice which parts of your body are tense. Try to relax the muscles in those areas.   How will I feel when I quit smoking? Day 1 to 3 weeks Within the first 24 hours, you may start to have some problems that come from quitting tobacco. These problems are very bad 2-3 days after you quit, but they do not often last for more than 2-3 weeks. You may get these symptoms:  Mood swings.  Feeling restless, nervous, angry, or annoyed.  Trouble concentrating.  Dizziness.  Strong desire for high-sugar foods and nicotine.  Weight gain.  Trouble pooping (constipation).  Feeling like you may vomit (nausea).  Coughing or a sore throat.  Changes in how the medicines that you take for other issues work in your body.  Depression.  Trouble sleeping (insomnia). Week 3 and afterward After the first 2-3 weeks of quitting, you may start to notice more positive results, such as:  Better sense of smell and taste.  Less coughing and sore throat.  Slower heart rate.  Lower blood pressure.  Clearer skin.  Better breathing.  Fewer sick days. Quitting smoking can be hard. Do not give up if you fail the first time. Some people need to try a few times before they succeed. Do your best to stick to your quit plan, and talk with your doctor if you have any questions or concerns. Summary  Smoking tobacco is the leading cause of preventable death. Quitting smoking can be hard, but it is one of the best things that you can do for your health.  When you decide to quit smoking, make a plan to help you succeed.  Quit smoking right away, not slowly over a period of time.  When you start quitting, seek help from your doctor, family, or friends. This information is not intended to replace advice given to you by your health care provider. Make sure you discuss any questions you have with your health care provider. Document Revised: 04/25/2019 Document  Reviewed: 10/19/2018 Elsevier Patient Education  2021 [employer].
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
## 2020-12-23 ENCOUNTER — Ambulatory Visit: Payer: BLUE CROSS/BLUE SHIELD | Admitting: Orthopaedic Surgery

## 2020-12-28 ENCOUNTER — Encounter: Payer: Self-pay | Admitting: Orthopaedic Surgery

## 2020-12-28 ENCOUNTER — Ambulatory Visit (INDEPENDENT_AMBULATORY_CARE_PROVIDER_SITE_OTHER): Payer: PRIVATE HEALTH INSURANCE | Admitting: Orthopaedic Surgery

## 2020-12-28 ENCOUNTER — Other Ambulatory Visit: Payer: Self-pay

## 2020-12-28 VITALS — BP 172/124 | HR 81 | Ht 66.0 in | Wt 190.0 lb

## 2020-12-28 DIAGNOSIS — M25562 Pain in left knee: Secondary | ICD-10-CM

## 2020-12-28 DIAGNOSIS — F17210 Nicotine dependence, cigarettes, uncomplicated: Secondary | ICD-10-CM | POA: Diagnosis not present

## 2020-12-28 NOTE — Progress Notes (Signed)
Wuertzer, Thomes Dinning, MD - 12/23/2020  Formatting of this note might be different from the original. MRI LEFT KNEE WITHOUT CONTRAST, 12/23/2020 11:16 AM  INDICATION: Knee trauma, meniscal/ligament injury suspected, xray done \ M25.562 Acute pain of left knee  COMPARISON: None.  TECHNIQUE: Multi-planar, multi-sequence MR imaging of the left knee was performed without contrast.   FINDINGS:  . Bone: No fracture, avascular necrosis, or marrow replacement. . Cruciate ligaments: Intact. . Collateral ligaments: Intact. . Medial meniscus: Intact. . Lateral meniscus: Intact. . Medial compartment: No deep chondral defect. . Lateral compartment: No deep chondral defect. . Patellofemoral compartment: No deep chondral defect. . Extensor mechanism: Intact. . Joint: No malalignment. No effusion. . Soft tissues: Tiny Baker cyst. Mild edema like signal at the superior lateral aspect of Hoffa's fat pad, as well as mild edema like signal within the overlying infrapatellar subcutaneous soft tissues.  CONCLUSION:  1. No evidence of acute bony, ligamentous or meniscal injury. 2. Mild edema at the superolateral Hoffa's fat pad and within the overlying infrapatellar subcutaneous soft tissues, possibly representing small contusions.

## 2020-12-28 NOTE — Progress Notes (Signed)
Patient Robert Snyder, male DOB:September 29, 1985, 35 y.o. NLG:[id number]  Chief Complaint  Patient presents with  . Results    Review MRI left knee    HPI  Robert Snyder is a 34 y.o. male who has left knee pain. He is improving with much less pain and swelling.  He had MRI which was negative for bone, meniscus or ligamentous injury.  I have explained the findings to him.  I have reviewed the report.   Body mass index is 30.67 kg/m.  ROS  Review of Systems  Constitutional: Positive for activity change.  Musculoskeletal: Positive for arthralgias, gait problem and joint swelling.  All other systems reviewed and are negative.   All other systems reviewed and are negative.  The following is a summary of the past history medically, past history surgically, known current medicines, social history and family history.  This information is gathered electronically by the computer from prior information and documentation.  I review this each visit and have found including this information at this point in the chart is beneficial and informative.    Past Medical History:  Diagnosis Date  . Hypertension     History reviewed. No pertinent surgical history.  Family History  Problem Relation Age of Onset  . High blood pressure Mother   . Diabetes Mother   . High blood pressure Father     Social History Social History   Tobacco Use  . Smoking status: Current Every Day Smoker    Packs/day: 1.00    Types: Cigarettes  . Smokeless tobacco: Never Used  Substance Use Topics  . Alcohol use: Yes    Comment: occ  . Drug use: Yes    Types: Marijuana    Comment: occ    Allergies  Allergen Reactions  . Penicillins Rash    Has patient had a PCN reaction causing immediate rash, facial/tongue/throat swelling, SOB or lightheadedness with hypotension: Yes Has patient had a PCN reaction causing severe rash involving mucus membranes or skin necrosis: No Has patient had a PCN reaction that  required hospitalization No Has patient had a PCN reaction occurring within the last 10 years: No If all of the above answers are "NO", then may proceed with Cephalosporin use.     Current Outpatient Medications  Medication Sig Dispense Refill  . hydrochlorothiazide (HYDRODIURIL) 25 MG tablet Take 1 tablet (25 mg total) by mouth daily. (Patient not taking: Reported on 11/30/2020) 20 tablet 0  . ibuprofen (ADVIL) 600 MG tablet Take 1 tablet (600 mg total) by mouth every 6 (six) hours as needed. (Patient not taking: Reported on 11/30/2020) 30 tablet 0  . ketorolac (ACULAR) 0.5 % ophthalmic solution Place 1 drop into both eyes every 6 (six) hours. (Patient not taking: Reported on 11/30/2020) 5 mL 0   No current facility-administered medications for this visit.     Physical Exam  Blood pressure (!) 172/124, pulse 81, height 5\' 6"  (1.676 m), weight 190 lb (86.2 kg).  Constitutional: overall normal hygiene, normal nutrition, well developed, normal grooming, normal body habitus. Assistive device:none  Musculoskeletal: gait and station Limp none, muscle tone and strength are normal, no tremors or atrophy is present.  .  Neurological: coordination overall normal.  Deep tendon reflex/nerve stretch intact.  Sensation normal.  Cranial nerves II-XII intact.   Skin:   Normal overall no scars, lesions, ulcers or rashes. No psoriasis.  Psychiatric: Alert and oriented x 3.  Recent memory intact, remote memory unclear.  Normal mood and affect.  Well groomed.  Good eye contact.  Cardiovascular: overall no swelling, no varicosities, no edema bilaterally, normal temperatures of the legs and arms, no clubbing, cyanosis and good capillary refill.  Left knee is stable, ROM full, no limp, NV intact.  Lymphatic: palpation is normal.  All other systems reviewed and are negative   The patient has been educated about the nature of the problem(s) and counseled on treatment options.  The patient appeared to  understand what I have discussed and is in agreement with it.  Encounter Diagnoses  Name Primary?  . Acute pain of left knee Yes  . Nicotine dependence, cigarettes, uncomplicated     PLAN Call if any problems.  Precautions discussed.  Continue current medications.   Return to clinic prn   Electronically Signed Darreld Mclean, MD 5/17/202210:31 AM

## 2020-12-28 NOTE — Patient Instructions (Addendum)
No tear on MRI scan/ no surgery needed on the knee. You have injury which will take several more weeks to heal. Stay out of work until 01/11/21. Then you can return to work full duty. If you need a return visit let us know. Gradually increase your activity    Steps to Quit Smoking Smoking tobacco is the leading cause of preventable death. It can affect almost every organ in the body. Smoking puts you and people around you at risk for many serious, long-lasting (chronic) diseases. Quitting smoking can be hard, but it is one of the best things that you can do for your health. It is never too late to quit. How do I get ready to quit? When you decide to quit smoking, make a plan to help you succeed. Before you quit:  Pick a date to quit. Set a date within the next 2 weeks to give you time to prepare.  Write down the reasons why you are quitting. Keep this list in places where you will see it often.  Tell your family, friends, and co-workers that you are quitting. Their support is important.  Talk with your doctor about the choices that may help you quit.  Find out if your health insurance will pay for these treatments.  Know the people, places, things, and activities that make you want to smoke (triggers). Avoid them. What first steps can I take to quit smoking?  Throw away all cigarettes at home, at work, and in your car.  Throw away the things that you use when you smoke, such as ashtrays and lighters.  Clean your car. Make sure to empty the ashtray.  Clean your home, including curtains and carpets. What can I do to help me quit smoking? Talk with your doctor about taking medicines and seeing a counselor at the same time. You are more likely to succeed when you do both.  If you are pregnant or breastfeeding, talk with your doctor about counseling or other ways to quit smoking. Do not take medicine to help you quit smoking unless your doctor tells you to do so. To quit smoking: Quit  right away  Quit smoking totally, instead of slowly cutting back on how much you smoke over a period of time.  Go to counseling. You are more likely to quit if you go to counseling sessions regularly. Take medicine You may take medicines to help you quit. Some medicines need a prescription, and some you can buy over-the-counter. Some medicines may contain a drug called nicotine to replace the nicotine in cigarettes. Medicines may:  Help you to stop having the desire to smoke (cravings).  Help to stop the problems that come when you stop smoking (withdrawal symptoms). Your doctor may ask you to use:  Nicotine patches, gum, or lozenges.  Nicotine inhalers or sprays.  Non-nicotine medicine that is taken by mouth. Find resources Find resources and other ways to help you quit smoking and remain smoke-free after you quit. These resources are most helpful when you use them often. They include:  Online chats with a Veterinary surgeon.  Phone quitlines.  Printed Materials engineer.  Support groups or group counseling.  Text messaging programs.  Mobile phone apps. Use apps on your mobile phone or tablet that can help you stick to your quit plan. There are many free apps for mobile phones and tablets as well as websites. Examples include Quit Guide from the [employer] and smokefree.gov   What things can I do to make it  easier to quit?  Talk to your family and friends. Ask them to support and encourage you.  Call a phone quitline (1-800-QUIT-NOW), reach out to support groups, or work with a Veterinary surgeon.  Ask people who smoke to not smoke around you.  Avoid places that make you want to smoke, such as: ? Bars. ? Parties. ? Smoke-break areas at work.  Spend time with people who do not smoke.  Lower the stress in your life. Stress can make you want to smoke. Try these things to help your stress: ? Getting regular exercise. ? Doing deep-breathing exercises. ? Doing yoga. ? Meditating. ? Doing a  body scan. To do this, close your eyes, focus on one area of your body at a time from head to toe. Notice which parts of your body are tense. Try to relax the muscles in those areas.   How will I feel when I quit smoking? Day 1 to 3 weeks Within the first 24 hours, you may start to have some problems that come from quitting tobacco. These problems are very bad 2-3 days after you quit, but they do not often last for more than 2-3 weeks. You may get these symptoms:  Mood swings.  Feeling restless, nervous, angry, or annoyed.  Trouble concentrating.  Dizziness.  Strong desire for high-sugar foods and nicotine.  Weight gain.  Trouble pooping (constipation).  Feeling like you may vomit (nausea).  Coughing or a sore throat.  Changes in how the medicines that you take for other issues work in your body.  Depression.  Trouble sleeping (insomnia). Week 3 and afterward After the first 2-3 weeks of quitting, you may start to notice more positive results, such as:  Better sense of smell and taste.  Less coughing and sore throat.  Slower heart rate.  Lower blood pressure.  Clearer skin.  Better breathing.  Fewer sick days. Quitting smoking can be hard. Do not give up if you fail the first time. Some people need to try a few times before they succeed. Do your best to stick to your quit plan, and talk with your doctor if you have any questions or concerns. Summary  Smoking tobacco is the leading cause of preventable death. Quitting smoking can be hard, but it is one of the best things that you can do for your health.  When you decide to quit smoking, make a plan to help you succeed.  Quit smoking right away, not slowly over a period of time.  When you start quitting, seek help from your doctor, family, or friends. This information is not intended to replace advice given to you by your health care provider. Make sure you discuss any questions you have with your health care  provider. Document Revised: 04/25/2019 Document Reviewed: 10/19/2018 Elsevier Patient Education  2021 [employer].

## 2021-07-14 ENCOUNTER — Encounter: Payer: Self-pay | Admitting: Orthopaedic Surgery

## 2021-07-14 ENCOUNTER — Ambulatory Visit (INDEPENDENT_AMBULATORY_CARE_PROVIDER_SITE_OTHER): Payer: PRIVATE HEALTH INSURANCE | Admitting: Orthopaedic Surgery

## 2021-07-14 ENCOUNTER — Other Ambulatory Visit: Payer: Self-pay

## 2021-07-14 VITALS — BP 203/142 | HR 73 | Ht 67.0 in | Wt 188.0 lb

## 2021-07-14 DIAGNOSIS — M545 Low back pain, unspecified: Secondary | ICD-10-CM

## 2021-07-14 DIAGNOSIS — M25562 Pain in left knee: Secondary | ICD-10-CM

## 2021-07-14 DIAGNOSIS — G8929 Other chronic pain: Secondary | ICD-10-CM

## 2021-07-14 DIAGNOSIS — M79605 Pain in left leg: Secondary | ICD-10-CM

## 2021-07-14 NOTE — Progress Notes (Signed)
I had pain in my left knee again.  He has pain of the lateral hamstring at the knee and the lateral gastroc several weeks ago with giving way one time.  He had cramping also.  He has not had any other episode.  He has some pain in the lower back with pain to the right hip area at times.  He has no trauma, no redness, no weakness, no numbness.  Exam of left knee is negative except for some tenderness of the distal lateral hamstring.  ROM is full.  There is no effusion.  No limp.  Lower back: Spine/Pelvis examination:  Inspection:  Overall, sacoiliac joint benign and hips nontender; without crepitus or defects.   Thoracic spine inspection: Alignment normal without kyphosis present   Lumbar spine inspection:  Alignment  with normal lumbar lordosis, without scoliosis apparent.   Thoracic spine palpation:  without tenderness of spinal processes   Lumbar spine palpation: without tenderness of lumbar area; without tightness of lumbar muscles    Range of Motion:   Lumbar flexion, forward flexion is normal without pain or tenderness    Lumbar extension is full without pain or tenderness   Left lateral bend is normal without pain or tenderness   Right lateral bend is normal without pain or tenderness   Straight leg raising is normal  Strength & tone: normal   Stability overall normal stability  Encounter Diagnoses  Name Primary?   Chronic pain of left knee Yes   Lumbar pain with radiation down left leg    He cannot take NSAIDs.  I have given exercises for him to do.  Use Aspercreme, Biofreeze or Voltaren Gel.  Return in one month.  Call if any problem.  Precautions discussed.  Electronically Signed Darreld Mclean, MD 12/1/20229:21 AM

## 2021-07-14 NOTE — Patient Instructions (Signed)
Aspercreme, Biofreeze or Voltaren Gel over the counter 2-3 times daily. Rub into area well each use for best results.

## 2021-08-16 ENCOUNTER — Ambulatory Visit: Payer: PRIVATE HEALTH INSURANCE | Admitting: Orthopaedic Surgery

## 2021-08-17 ENCOUNTER — Encounter: Payer: Self-pay | Admitting: Orthopaedic Surgery

## 2021-08-23 ENCOUNTER — Ambulatory Visit: Payer: PRIVATE HEALTH INSURANCE | Admitting: Orthopaedic Surgery

## 2021-11-10 ENCOUNTER — Ambulatory Visit: Payer: Self-pay

## 2021-11-10 ENCOUNTER — Encounter: Payer: Self-pay | Admitting: Orthopaedic Surgery

## 2021-11-10 ENCOUNTER — Ambulatory Visit (INDEPENDENT_AMBULATORY_CARE_PROVIDER_SITE_OTHER): Payer: PRIVATE HEALTH INSURANCE | Admitting: Orthopaedic Surgery

## 2021-11-10 VITALS — BP 194/133 | HR 74 | Ht 66.0 in | Wt 192.0 lb

## 2021-11-10 DIAGNOSIS — G8929 Other chronic pain: Secondary | ICD-10-CM

## 2021-11-10 DIAGNOSIS — M25562 Pain in left knee: Secondary | ICD-10-CM

## 2021-11-10 MED ORDER — NAPROXEN 500 MG PO TABS: 500.0000 mg | ORAL_TABLET | Freq: Two times a day (BID) | ORAL | 5 refills | Status: DC

## 2021-11-10 NOTE — Progress Notes (Signed)
My knee is hurting again. ? ?For the last month or so, he has been having increased pain in the left knee.  He has swelling, popping but no giving way.  He has medial knee pain and some pain posteriorly over the distal hamstring area.  He has no redness.  He has tried ice, knee brace and Tylenol. ? ?He had negative MRI last year.  I have reviewed that report. ? ?Left knee has crepitus, slight effusion, medial joint line pain, weakly positive medial McMurray, limp left, NV intact, no distal edema. ? ?X-rays were done of the left knee, reported separately. ? ?Encounter Diagnosis  ?Name Primary?  ? Chronic pain of left knee Yes  ? ?PROCEDURE NOTE: ? ?The patient requests injections of the left knee , verbal consent was obtained. ? ?The left knee was prepped appropriately after time out was performed.  ? ?Sterile technique was observed and injection of 1 cc of DepoMedrol 40 mg with several cc's of plain xylocaine. Anesthesia was provided by ethyl chloride and a 20-gauge needle was used to inject the knee area. The injection was tolerated well.  A band aid dressing was applied. ? ?The patient was advised to apply ice later today and tomorrow to the injection sight as needed. ? ?I will begin naprosyn 500 po bid pc ? ?Return in two weeks. ? ?He may need new MRI. ? ?Call if any problem. ? ?Precautions discussed. ? ?Electronically Signed ?Darreld Mclean, MD ?3/30/20239:19 AM ? ?

## 2021-11-24 ENCOUNTER — Ambulatory Visit (INDEPENDENT_AMBULATORY_CARE_PROVIDER_SITE_OTHER): Payer: Self-pay | Admitting: Orthopaedic Surgery

## 2021-11-24 ENCOUNTER — Encounter: Payer: Self-pay | Admitting: Orthopaedic Surgery

## 2021-11-24 ENCOUNTER — Ambulatory Visit: Payer: Self-pay | Admitting: Orthopaedic Surgery

## 2021-11-24 VITALS — BP 158/121 | HR 81 | Ht 66.0 in | Wt 188.0 lb

## 2021-11-24 DIAGNOSIS — F17210 Nicotine dependence, cigarettes, uncomplicated: Secondary | ICD-10-CM | POA: Diagnosis not present

## 2021-11-24 DIAGNOSIS — M25562 Pain in left knee: Secondary | ICD-10-CM

## 2021-11-24 DIAGNOSIS — G8929 Other chronic pain: Secondary | ICD-10-CM | POA: Diagnosis not present

## 2021-11-24 NOTE — Progress Notes (Signed)
I am much better. ? ?His left knee is much improved.  He has no pain, walking well, no swelling. ? ?Left knee has full ROM, stable, normal gait, NV intact. ? ?Encounter Diagnoses  ?Name Primary?  ? Chronic pain of left knee Yes  ? Nicotine dependence, cigarettes, uncomplicated   ? ?I will see prn. ? ?Call if any problem. ? ?Precautions discussed. ? ?Electronically Signed ?Darreld Mclean, MD ?4/13/202311:23 AM ? ?

## 2021-11-24 NOTE — Patient Instructions (Signed)

## 2022-02-21 ENCOUNTER — Ambulatory Visit: Payer: Self-pay | Admitting: Orthopaedic Surgery

## 2022-04-08 ENCOUNTER — Ambulatory Visit
Admission: EM | Admit: 2022-04-08 | Discharge: 2022-04-08 | Disposition: A | Discharge status: Home / Self Care | Payer: Self-pay | Attending: Family Medicine | Admitting: Family Medicine

## 2022-04-08 DIAGNOSIS — K644 Residual hemorrhoidal skin tags: Secondary | ICD-10-CM | POA: Insufficient documentation

## 2022-04-08 DIAGNOSIS — R369 Urethral discharge, unspecified: Secondary | ICD-10-CM | POA: Insufficient documentation

## 2022-04-08 DIAGNOSIS — R030 Elevated blood-pressure reading, without diagnosis of hypertension: Secondary | ICD-10-CM | POA: Insufficient documentation

## 2022-04-08 MED ORDER — AMLODIPINE BESYLATE 10 MG PO TABS: 10.0000 mg | ORAL_TABLET | Freq: Every day | ORAL | 1 refills | Status: DC

## 2022-04-08 MED ORDER — HYDROCORTISONE (PERIANAL) 2.5 % EX CREA: 1.0000 | TOPICAL_CREAM | Freq: Two times a day (BID) | CUTANEOUS | 0 refills | Status: DC

## 2022-04-08 MED ORDER — HYDROCHLOROTHIAZIDE 25 MG PO TABS: 25.0000 mg | ORAL_TABLET | Freq: Every day | ORAL | 1 refills | Status: DC

## 2022-04-08 NOTE — ED Provider Notes (Signed)
[facility]    CSN: [id number] Arrival date & time: 04/08/22  0919      History   Chief Complaint Chief Complaint  Patient presents with   Exposure to STD    HPI Robert Snyder is a 36 y.o. male.   Patient presenting today with 1 day history of white discharge, dysuria.  Denies rashes, lesions, pelvic or abdominal pain, fever, chills, nausea vomiting or diarrhea.  States has a monogamous sexual partner.  Not tried anything over-the-counter for symptoms.  Also having issues with inflamed external hemorrhoids currently.  Does occasionally have to strain for bowel movements.  Denies any significant anorectal pain, bleeding, abdominal pain.  Trying some Preparation H with no relief.  He also states his been out of his blood pressure medication for quite some time, used to take amlodipine and hydrochlorothiazide.  Does not currently have a primary care provider.  Denies chest pain, shortness of breath, dizziness, headaches.    Past Medical History:  Diagnosis Date   Hypertension     There are no problems to display for this patient.   History reviewed. No pertinent surgical history.     Home Medications    Prior to Admission medications   Medication Sig Start Date End Date Taking? Authorizing Provider  hydrocortisone (ANUSOL-HC) 2.5 % rectal cream Place 1 Application rectally 2 (two) times daily. 04/08/22  Yes Particia Nearing, PA-C  amLODipine (NORVASC) 10 MG tablet Take 1 tablet (10 mg total) by mouth daily. 04/08/22   Particia Nearing, PA-C  hydrochlorothiazide (HYDRODIURIL) 25 MG tablet Take 1 tablet (25 mg total) by mouth daily. 04/08/22   Particia Nearing, PA-C  naproxen (NAPROSYN) 500 MG tablet Take 1 tablet (500 mg total) by mouth 2 (two) times daily with a meal. 11/10/21   Darreld Mclean, MD    Family History Family History  Problem Relation Age of Onset   High blood pressure Mother    Diabetes Mother    High blood pressure Father      Social History Social History   Tobacco Use   Smoking status: Every Day    Packs/day: 0.50    Types: Cigarettes   Smokeless tobacco: Never  Vaping Use   Vaping Use: Never used  Substance Use Topics   Alcohol use: Yes    Comment: occ   Drug use: Yes    Types: Marijuana    Comment: occ     Allergies   Penicillins   Review of Systems Review of Systems Per HPI  Physical Exam Triage Vital Signs ED Triage Vitals  Enc Vitals Group     BP 04/08/22 0956 (!) 182/17     Pulse Rate 04/08/22 0956 77     Resp 04/08/22 0956 (!) 22     Temp 04/08/22 0956 97.9 F (36.6 C)     Temp Source 04/08/22 0956 Oral     SpO2 04/08/22 0956 98 %     Weight --      Height --      Head Circumference --      Peak Flow --      Pain Score 04/08/22 0954 0     Pain Loc --      Pain Edu? --      Excl. in GC? --    No data found.  Updated Vital Signs BP (!) 182/117 (BP Location: Right Arm)   Pulse 77   Temp 97.9 F (36.6 C) (Oral)   Resp Marland Kitchen)  22   SpO2 98%   Visual Acuity Right Eye Distance:   Left Eye Distance:   Bilateral Distance:    Right Eye Near:   Left Eye Near:    Bilateral Near:     Physical Exam Vitals and nursing note reviewed.  Constitutional:      Appearance: Normal appearance.  HENT:     Head: Atraumatic.     Mouth/Throat:     Mouth: Mucous membranes are moist.  Eyes:     Extraocular Movements: Extraocular movements intact.     Conjunctiva/sclera: Conjunctivae normal.  Cardiovascular:     Rate and Rhythm: Normal rate and regular rhythm.  Pulmonary:     Effort: Pulmonary effort is normal.     Breath sounds: Normal breath sounds.  Abdominal:     General: Bowel sounds are normal. There is no distension.     Palpations: Abdomen is soft.     Tenderness: There is no abdominal tenderness. There is no right CVA tenderness, left CVA tenderness or guarding.  Genitourinary:    Comments: Declines GU exam Musculoskeletal:        General: Normal range of  motion.     Cervical back: Normal range of motion and neck supple.  Skin:    General: Skin is warm and dry.  Neurological:     General: No focal deficit present.     Mental Status: He is oriented to person, place, and time.  Psychiatric:        Mood and Affect: Mood normal.        Thought Content: Thought content normal.        Judgment: Judgment normal.      UC Treatments / Results  Labs (all labs ordered are listed, but only abnormal results are displayed) Labs Reviewed  CYTOLOGY, (ORAL, ANAL, URETHRAL) ANCILLARY ONLY    EKG   Radiology No results found.  Procedures Procedures (including critical care time)  Medications Ordered in UC Medications - No data to display  Initial Impression / Assessment and Plan / UC Course  I have reviewed the triage vital signs and the nursing notes.  Pertinent labs & imaging results that were available during my care of the patient were reviewed by me and considered in my medical decision making (see chart for details).     Cytology swab pending for further rule out of his penile discharge and dysuria.  Discussed importance of establishing with a primary care provider for follow-up in case not resolving as well as to follow-up on his blood pressure readings.  We will restart his hydrochlorothiazide and amlodipine today and discussed to closely monitor these at home and follow-up with primary care.  Resources given for establishing with new primary care.  We will also give Anusol cream for his external hemorrhoids and discussed good bowel regimen, Tucks wipes, sitz bath's.  Return for any worsening symptoms prior to establishing PCP.  Final Clinical Impressions(s) / UC Diagnoses   Final diagnoses:  Penile discharge  External hemorrhoid  Elevated blood pressure reading     Discharge Instructions      You may go to the [facility] health website and go to the find a provider tab to see who is taking new patients currently and even  schedule your primary care appointment online    ED Prescriptions     Medication Sig Dispense Auth. Provider   hydrocortisone (ANUSOL-HC) 2.5 % rectal cream Place 1 Application rectally 2 (two) times daily. [place], [place],  PA-C   amLODipine (NORVASC) 10 MG tablet Take 1 tablet (10 mg total) by mouth daily. 30 tablet Particia Nearing, [place]   hydrochlorothiazide (HYDRODIURIL) 25 MG tablet Take 1 tablet (25 mg total) by mouth daily. 30 tablet Particia Nearing, [place]      PDMP not reviewed this encounter.   Particia Nearing, [place] 04/08/22 1016

## 2022-04-08 NOTE — ED Triage Notes (Signed)
Pt states that yesterday he had some white discharge and burning from penis.   Denies Meds

## 2022-04-08 NOTE — Discharge Instructions (Signed)
You may go to the [facility] health website and go to the find a provider tab to see who is taking new patients currently and even schedule your primary care appointment online

## 2022-04-10 LAB — CYTOLOGY, (ORAL, ANAL, URETHRAL) ANCILLARY ONLY
Chlamydia: NEGATIVE
Comment: NEGATIVE
Comment: NEGATIVE
Comment: NORMAL
Neisseria Gonorrhea: POSITIVE — AB
Trichomonas: NEGATIVE

## 2022-04-11 ENCOUNTER — Telehealth (HOSPITAL_COMMUNITY): Payer: Self-pay | Admitting: Emergency Medicine

## 2022-04-11 ENCOUNTER — Ambulatory Visit
Admission: EM | Admit: 2022-04-11 | Discharge: 2022-04-11 | Disposition: A | Discharge status: Home / Self Care | Payer: Self-pay | Attending: Nurse Practitioner | Admitting: Nurse Practitioner

## 2022-04-11 DIAGNOSIS — A549 Gonococcal infection, unspecified: Secondary | ICD-10-CM

## 2022-04-11 MED ORDER — GENTAMICIN SULFATE 40 MG/ML IJ SOLN
240.0000 mg | Freq: Once | INTRAMUSCULAR | Status: AC
  Administered 2022-04-11: 240 mg via INTRAMUSCULAR

## 2022-04-11 MED ORDER — AZITHROMYCIN 500 MG PO TABS
2000.0000 mg | ORAL_TABLET | Freq: Once | ORAL | Status: DC
  Administered 2022-04-11: 2000 mg via ORAL

## 2022-04-11 NOTE — ED Triage Notes (Signed)
Pt presents for Gonorrhea treatment.

## 2022-04-11 NOTE — Telephone Encounter (Signed)
Per protocol, patient will need treatment with IM ROcephin 500mg for positive Gonorrhea. Contacted patient by phone.  Verified identity using two identifiers.  Provided positive result.  Reviewed safe sex practices, notifying partners, and refraining from sexual activities for 7 days from time of treatment.  Patient verified understanding, all questions answered.   HHS notified 

## 2022-04-18 ENCOUNTER — Telehealth: Payer: Self-pay | Admitting: Orthopaedic Surgery

## 2022-04-18 NOTE — Telephone Encounter (Signed)
Patient called w/question regarding process of requesting records. Discussed - also checked once patient mentioned records are for attorney, [employer] - per release (ROI) system, records have been sent via medical records provider [employer] online portal. Patient will check back with requester's (law group) office.

## 2022-10-12 ENCOUNTER — Encounter: Payer: Self-pay | Admitting: Radiology
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
## 2023-10-31 ENCOUNTER — Ambulatory Visit (HOSPITAL_BASED_OUTPATIENT_CLINIC_OR_DEPARTMENT_OTHER): Admitting: Family Medicine

## 2023-11-12 ENCOUNTER — Encounter (HOSPITAL_BASED_OUTPATIENT_CLINIC_OR_DEPARTMENT_OTHER): Payer: Self-pay | Admitting: Family Medicine

## 2023-11-12 ENCOUNTER — Ambulatory Visit (INDEPENDENT_AMBULATORY_CARE_PROVIDER_SITE_OTHER): Admitting: Family Medicine

## 2023-11-12 VITALS — BP 163/121 | HR 77 | Ht 66.0 in | Wt 181.6 lb

## 2023-11-12 DIAGNOSIS — I10 Essential (primary) hypertension: Secondary | ICD-10-CM | POA: Insufficient documentation

## 2023-11-12 DIAGNOSIS — F17200 Nicotine dependence, unspecified, uncomplicated: Secondary | ICD-10-CM | POA: Diagnosis not present

## 2023-11-12 MED ORDER — AMLODIPINE BESYLATE 5 MG PO TABS: 5.0000 mg | ORAL_TABLET | Freq: Every day | ORAL | 1 refills | Status: DC

## 2023-11-12 NOTE — Patient Instructions (Signed)
  Medication Instructions:  Your physician recommends that you continue on your current medications as directed. Please refer to the Current Medication list given to you today. --If you need a refill on any your medications before your next appointment, please call your pharmacy first. If no refills are authorized on file call the office.--  Follow-Up: Your next appointment:   Your physician recommends that you schedule a follow-up appointment in: 3 week follow up  with Dr. de [place]  You will receive a text message or e-mail with a link to a survey about your care and experience with [place] today! We would greatly appreciate your feedback!   Thanks for letting us be apart of your health journey!!  Primary Care and Sports Medicine   Dr. Ceasar Mons [place]   We encourage you to activate your patient portal called "MyChart".  Sign up information is provided on this After Visit Summary.  MyChart is used to connect with patients for Virtual Visits (Telemedicine).  Patients are able to view lab/test results, encounter notes, upcoming appointments, etc.  Non-urgent messages can be sent to your provider as well. To learn more about what you can do with MyChart, please visit --  [email or web address].

## 2023-11-12 NOTE — Assessment & Plan Note (Signed)
 We will further assist with a goal of smoking cessation.  Will primarily focus on better control of blood pressure and further discuss management considerations for smoking cessation in the future.  Once blood pressure is better controlled, Chantix will be an option for patient.

## 2023-11-12 NOTE — Assessment & Plan Note (Signed)
 At this time, we will have patient resume amlodipine at low-dose and monitor response.  Discussed importance of gradually controlling blood pressure.  We will want to gradually obtain better control of blood pressure to try to avoid any negative effects of dropping blood pressure too quickly. Discussed considerations for next steps in management of medication.  Likely we would look to utilize alternative medication at low-dose with amlodipine, discussed possible role for low-dose of valsartan to be taken with amlodipine. Recommend intermittent monitoring of blood pressure at home, DASH diet

## 2023-11-12 NOTE — Progress Notes (Signed)
 New Patient Office Visit  Subjective   Patient ID: Robert Snyder, male    DOB: March 08, 1986  Age: 38 y.o. MRN: [id number]  CC:  Chief Complaint  Patient presents with   New Patient (Initial Visit)    New patient has had pcp bp has been extremely high went to ER and urgent care     HPI Robert Snyder presents to establish care Last PCP - none since he was a teenager  HTN: does check BP at home, similar to our reading in office today. No chest pain; does occasionally get headaches. Prescribed hydrochlorothiazide and amlodipine in the past, no side effect from these in the past. Inconsistent use of meds due to no PCP. FH of HTN  Tobacco use: smokes currently. Does want to quit, no attempts in the past. Smokes about 1 ppd. Has smoked for about 22 years.  Patient has questions about colon cancer screening.  Thinks that he had a great uncle diagnosed with colon cancer, does indicate that he was older at time of diagnosis  Patient is originally from [place]. Has lived here for about 28 years. Works in [employer]. 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  Outpatient Encounter Medications as of 11/12/2023  Medication Sig   amLODipine (NORVASC) 5 MG tablet Take 1 tablet (5 mg total) by mouth daily.   hydrochlorothiazide (HYDRODIURIL) 25 MG tablet Take 1 tablet (25 mg total) by mouth daily. (Patient not taking: Reported on 11/12/2023)   [DISCONTINUED] amLODipine (NORVASC) 10 MG tablet Take 1 tablet (10 mg total) by mouth daily. (Patient not taking: Reported on 11/12/2023)   [DISCONTINUED] hydrocortisone (ANUSOL-HC) 2.5 % rectal cream Place 1 Application rectally 2 (two) times daily.   [DISCONTINUED] naproxen (NAPROSYN) 500 MG tablet Take 1 tablet (500 mg total) by mouth 2 (two) times daily with a meal.   No facility-administered encounter medications on file as of 11/12/2023.    Past Medical History:  Diagnosis Date   Hypertension     History reviewed. No pertinent  surgical history.  Family History  Problem Relation Age of Onset   High blood pressure Mother    Diabetes Mother    High blood pressure Father     Social History   Socioeconomic History   Marital status: Single    Spouse name: Not on file   Number of children: Not on file   Years of education: Not on file   Highest education level: Not on file  Occupational History   Not on file  Tobacco Use   Smoking status: Every Day    Current packs/day: 0.50    Types: Cigarettes    Passive exposure: Current   Smokeless tobacco: Never  Vaping Use   Vaping status: Never Used  Substance and Sexual Activity   Alcohol use: Yes    Comment: occ   Drug use: Yes    Types: Marijuana    Comment: occ   Sexual activity: Yes    Birth control/protection: None  Other Topics Concern   Not on file  Social History Narrative   Not on file   Social Drivers of Health   Financial Resource Strain: Not on file  Food Insecurity: Not on file  Transportation Needs: Not on file  Physical Activity: Not on file  Stress: Not on file  Social Connections: Not on file  Intimate Partner Violence: Not on file    Objective   BP (!) 163/121 (BP Location: Left Arm, Patient Position: Sitting, Cuff  Size: Normal)   Pulse 77   Ht 5\' 6"  (1.676 m)   Wt 181 lb 9.6 oz (82.4 kg)   SpO2 100%   BMI 29.31 kg/m   Physical Exam  38 year old male in no acute distress Cardiovascular exam with regular rate and rhythm Lungs clear to auscultation bilaterally  Assessment & Plan:   Primary hypertension Assessment & Plan: At this time, we will have patient resume amlodipine at low-dose and monitor response.  Discussed importance of gradually controlling blood pressure.  We will want to gradually obtain better control of blood pressure to try to avoid any negative effects of dropping blood pressure too quickly. Discussed considerations for next steps in management of medication.  Likely we would look to utilize  alternative medication at low-dose with amlodipine, discussed possible role for low-dose of valsartan to be taken with amlodipine. Recommend intermittent monitoring of blood pressure at home, DASH diet   Tobacco use disorder Assessment & Plan: We will further assist with a goal of smoking cessation.  Will primarily focus on better control of blood pressure and further discuss management considerations for smoking cessation in the future.  Once blood pressure is better controlled, Chantix will be an option for patient.   Other orders -     amLODIPine Besylate; Take 1 tablet (5 mg total) by mouth daily.  Dispense: 30 tablet; Refill: 1  Patient also mentioned history of hemorrhoids.  Indicates that he will have painless bleeding intermittently.  We discussed general considerations related to hemorrhoids.  Discussed need for avoidance of constipation, straining.  Provided handout reviewed conservative measures.  Also discussed considerations for meeting with specialist.  For now, patient will monitor and let us know if he would like to have referral placed  We discussed recommendations for patients at average risk of colon cancer, discussed recommendation for screening to begin at age 40  Return in about 3 weeks (around 12/03/2023) for hypertension.    ___________________________________________ Robert Stanke de [place], MD, ABFM, CAQSM Primary Care and Sports Medicine [facility]

## 2024-01-02 ENCOUNTER — Ambulatory Visit (HOSPITAL_BASED_OUTPATIENT_CLINIC_OR_DEPARTMENT_OTHER): Admitting: Family Medicine

## 2024-01-03 ENCOUNTER — Ambulatory Visit (HOSPITAL_BASED_OUTPATIENT_CLINIC_OR_DEPARTMENT_OTHER): Admitting: Family Medicine

## 2024-01-14 ENCOUNTER — Ambulatory Visit: Admitting: Family Medicine

## 2024-01-20 ENCOUNTER — Other Ambulatory Visit (HOSPITAL_BASED_OUTPATIENT_CLINIC_OR_DEPARTMENT_OTHER): Payer: Self-pay | Admitting: Family Medicine

## 2024-02-07 ENCOUNTER — Ambulatory Visit (HOSPITAL_BASED_OUTPATIENT_CLINIC_OR_DEPARTMENT_OTHER): Admitting: Family Medicine

## 2024-02-07 ENCOUNTER — Encounter (HOSPITAL_BASED_OUTPATIENT_CLINIC_OR_DEPARTMENT_OTHER): Payer: Self-pay | Admitting: Family Medicine

## 2024-02-07 VITALS — BP 142/105 | HR 79 | Ht 66.0 in | Wt 179.1 lb

## 2024-02-07 DIAGNOSIS — R361 Hematospermia: Secondary | ICD-10-CM | POA: Diagnosis not present

## 2024-02-07 DIAGNOSIS — I10 Essential (primary) hypertension: Secondary | ICD-10-CM

## 2024-02-07 DIAGNOSIS — Z0000 Encounter for general adult medical examination without abnormal findings: Secondary | ICD-10-CM

## 2024-02-07 DIAGNOSIS — R200 Anesthesia of skin: Secondary | ICD-10-CM | POA: Insufficient documentation

## 2024-02-07 NOTE — Assessment & Plan Note (Signed)
 Patient also notes having occasional numbness over area by right thumb.  He does recall history of injuries and fractures to right hand.  Has not had any prior surgical treatment of these fractures or injuries.  Numbness occurs sporadically during the day, typically will resolve just with positional changes.  He has not noticed any significant strength issues.  Does not feel that it significantly affects day-to-day activities. We discussed considerations and for now, patient would prefer to continue with monitoring, could consider further evaluation with specialist, likely orthopedist.

## 2024-02-07 NOTE — Assessment & Plan Note (Signed)
 Reports recent episode of hematospermia since last visit with me.  He did present to urgent care for further evaluation, reports that they did do urine testing that time and he was started on antibiotic for treatment of potential prostatitis.  He reports having 1 further episode of hematospermia since evaluation of the urgent care.  He was recommended to have evaluation with urologist, but wanted to meet with me first prior to proceeding with referral We discussed considerations today and I do feel it would be appropriate to have further evaluation with urologist.  We discussed options for referral and did place to preferred office in the area.  We will also like to request records from urgent care

## 2024-02-07 NOTE — Assessment & Plan Note (Signed)
 Blood pressure elevated in office today, however has improved some since last visit with me.  He denies any current issues with chest pain or headaches.  Has not been checking blood pressure regularly at home currently.  Does continue with amlodipine , denies any side effects. We discussed considerations today.  Patient would prefer to focus on lifestyle modifications and continue with current medication regimen, no changes made today.

## 2024-02-07 NOTE — Patient Instructions (Signed)
  Medication Instructions:  Your physician recommends that you continue on your current medications as directed. Please refer to the Current Medication list given to you today. --If you need a refill on any your medications before your next appointment, please call your pharmacy first. If no refills are authorized on file call the office.-- Lab Work: Your physician has recommended that you have lab work today: 1 week before next visit  If you have labs (blood work) drawn today and your tests are completely normal, you will receive your results via MyChart message OR a phone call from our staff.  Please ensure you check your voicemail in the event that you authorized detailed messages to be left on a delegated number. If you have any lab test that is abnormal or we need to change your treatment, we will call you to review the results.   Follow-Up: Your next appointment:   Your physician recommends that you schedule a follow-up appointment in: 2 months physical with Dr. de [place]  You will receive a text message or e-mail with a link to a survey about your care and experience with [place] today! We would greatly appreciate your feedback!   Thanks for letting us be apart of your health journey!!  Primary Care and Sports Medicine   Dr. Ceasar Mons [place]   We encourage you to activate your patient portal called "MyChart".  Sign up information is provided on this After Visit Summary.  MyChart is used to connect with patients for Virtual Visits (Telemedicine).  Patients are able to view lab/test results, encounter notes, upcoming appointments, etc.  Non-urgent messages can be sent to your provider as well. To learn more about what you can do with MyChart, please visit --  [email or web address].

## 2024-02-07 NOTE — Progress Notes (Signed)
    Procedures performed today:    None.  Independent interpretation of notes and tests performed by another provider:   None.  Brief History, Exam, Impression, and Recommendations:    BP (!) 142/105 (BP Location: Left Arm, Patient Position: Sitting, Cuff Size: Normal)   Pulse 79   Ht 5' 6 (1.676 m)   Wt 179 lb 1.6 oz (81.2 kg)   SpO2 100%   BMI 28.91 kg/m   Primary hypertension Assessment & Plan: Blood pressure elevated in office today, however has improved some since last visit with me.  He denies any current issues with chest pain or headaches.  Has not been checking blood pressure regularly at home currently.  Does continue with amlodipine , denies any side effects. We discussed considerations today.  Patient would prefer to focus on lifestyle modifications and continue with current medication regimen, no changes made today.   Hematospermia Assessment & Plan: Reports recent episode of hematospermia since last visit with me.  He did present to urgent care for further evaluation, reports that they did do urine testing that time and he was started on antibiotic for treatment of potential prostatitis.  He reports having 1 further episode of hematospermia since evaluation of the urgent care.  He was recommended to have evaluation with urologist, but wanted to meet with me first prior to proceeding with referral We discussed considerations today and I do feel it would be appropriate to have further evaluation with urologist.  We discussed options for referral and did place to preferred office in the area.  We will also like to request records from urgent care  Orders: -     Ambulatory referral to Urology  Wellness examination -     CBC with Differential/Platelet; Future -     Comprehensive metabolic panel with GFR; Future -     Hemoglobin A1c; Future -     Lipid panel; Future -     TSH Rfx on Abnormal to Free T4; Future  Thumb numbness Assessment & Plan: Patient also notes  having occasional numbness over area by right thumb.  He does recall history of injuries and fractures to right hand.  Has not had any prior surgical treatment of these fractures or injuries.  Numbness occurs sporadically during the day, typically will resolve just with positional changes.  He has not noticed any significant strength issues.  Does not feel that it significantly affects day-to-day activities. We discussed considerations and for now, patient would prefer to continue with monitoring, could consider further evaluation with specialist, likely orthopedist.   Return in about 2 months (around 04/08/2024) for CPE with fasting labs 1 week prior.   ___________________________________________ Laxmi Choung de [place], MD, ABFM, CAQSM Primary Care and Sports Medicine [facility]

## 2024-02-26 ENCOUNTER — Ambulatory Visit (HOSPITAL_BASED_OUTPATIENT_CLINIC_OR_DEPARTMENT_OTHER): Admitting: Family Medicine

## 2024-03-06 DIAGNOSIS — Z87438 Personal history of other diseases of male genital organs: Secondary | ICD-10-CM | POA: Insufficient documentation

## 2024-03-06 NOTE — Progress Notes (Signed)
 Name: DEMORIO Snyder DOB: March 03, 1986 MRN: [id number]  History of Present Illness: Mr. Robert Snyder is a 38 y.o. male who presents today as a new patient at [facility] [place].   He reports chief complaint of possible microscopic hematuria. > 01/30/2024: - Seen at Urgent Care for hematospermia.  - UA showed trace blood. - STD panel negative for mycoplasma genitalium, gonorrhea, chlamydia, trichomonas. - Treated with Bactrim for acute bacterial prostatitis.  Today: He denies increased urinary urgency, frequency, nocturia, dysuria, gross hematuria, hesitancy, straining to void, or sensations of incomplete emptying. He denies flank pain or abdominal pain. He denies fevers, nausea, or vomiting.  He denies prior history of gross hematuria.  He denies history of kidney stones.  He denies history of pyelonephritis.  He denies history of recent or recurrent UTI or prostatitis. He denies history of GU malignancy or pelvic radiation.  He denies history of autoimmune disease. He denies history of sickle cell disease or sickle cell trait. He reports history of smoking. He denies taking anticoagulants.  He denies previous episodes of hematospermia. He states since taking the antibiotic his urine and sexual function have all returned to normal.    Medications: Current Outpatient Medications  Medication Sig Dispense Refill   amLODipine  (NORVASC ) 5 MG tablet TAKE ONE TABLET BY MOUTH DAILY 30 tablet 0   No current facility-administered medications for this visit.    Allergies: Allergies  Allergen Reactions   Penicillins Rash    Has patient had a PCN reaction causing immediate rash, facial/tongue/throat swelling, SOB or lightheadedness with hypotension: Yes Has patient had a PCN reaction causing severe rash involving mucus membranes or skin necrosis: No Has patient had a PCN reaction that required hospitalization No Has patient had a PCN reaction occurring within the last 10  years: No If all of the above answers are NO, then may proceed with Cephalosporin use.     Past Medical History:  Diagnosis Date   Hypertension    History reviewed. No pertinent surgical history. Family History  Problem Relation Age of Onset   High blood pressure Mother    Diabetes Mother    High blood pressure Father    Social History   Socioeconomic History   Marital status: Single    Spouse name: Not on file   Number of children: Not on file   Years of education: Not on file   Highest education level: Not on file  Occupational History   Not on file  Tobacco Use   Smoking status: Every Day    Current packs/day: 0.50    Types: Cigarettes    Passive exposure: Current   Smokeless tobacco: Never  Vaping Use   Vaping status: Never Used  Substance and Sexual Activity   Alcohol use: Yes    Comment: occ   Drug use: Yes    Types: Marijuana    Comment: occ   Sexual activity: Yes    Birth control/protection: None  Other Topics Concern   Not on file  Social History Narrative   Not on file   Social Drivers of Health   Financial Resource Strain: Not on file  Food Insecurity: Not on file  Transportation Needs: Not on file  Physical Activity: Not on file  Stress: Not on file  Social Connections: Not on file  Intimate Partner Violence: Not on file    SUBJECTIVE  Review of Systems Constitutional: Patient denies any unintentional weight loss or change in strength lntegumentary: Patient denies any rashes or  pruritus Cardiovascular: Patient denies chest pain or syncope Respiratory: Patient denies shortness of breath Gastrointestinal: Patient denies nausea, vomiting, constipation, or diarrhea  Musculoskeletal: Patient denies muscle cramps or weakness Neurologic: Patient denies convulsions or seizures Allergic/Immunologic: Patient denies recent allergic reaction(s) Hematologic/Lymphatic: Patient denies bleeding tendencies Endocrine: Patient denies heat/cold  intolerance  GU: As per HPI.  OBJECTIVE Vitals:   03/10/24 0950  BP: (!) 158/119  Pulse: 77   There is no height or weight on file to calculate BMI.  Physical Examination Constitutional: No obvious distress; patient is non-toxic appearing  Cardiovascular: No visible lower extremity edema.  Respiratory: The patient does not have audible wheezing/stridor; respirations do not appear labored  Gastrointestinal: Abdomen non-distended Musculoskeletal: Normal ROM of UEs  Skin: No obvious rashes/open sores  Neurologic: CN 2-12 grossly intact Psychiatric: Answered questions appropriately with normal affect  Hematologic/Lymphatic/Immunologic: No obvious bruises or sites of spontaneous bleeding  - UA: 2+ blood, trace protein - Urine microscopy: unremarkable (0-5 WBC/hpf, 0-2 RBC/hpf, 0 bacteria)  ASSESSMENT Microscopic hematuria - Plan: Urinalysis, Routine w reflex microscopic  Hematospermia - Plan: Urinalysis, Routine w reflex microscopic  History of acute prostatitis - Plan: Urinalysis, Routine w reflex microscopic  Tobacco use disorder - Plan: Urinalysis, Routine w reflex microscopic  No acute findings at this time. We discussed the difference between urine dipstick UA versus urine microscopy. No evidence of microscopic hematuria. He is asymptomatic at this time; suspect prior symptoms were due to acute bacterial prostatitis. We discussed pt's nicotine use as a risk factor for GU cancer and encouraged cessation. We discussed option for recheck with repeat urine microscopy in 6 months or follow up on a PRN basis; he elected the latter. All questions were answered.  PLAN 1. Return if symptoms worsen or fail to improve.  Orders Placed This Encounter  Procedures   Urinalysis, Routine w reflex microscopic    It has been explained that the patient is to follow regularly with their PCP in addition to all other providers involved in their care and to follow instructions provided by these  respective offices. Patient advised to contact urology clinic if any urologic-pertaining questions, concerns, new symptoms or problems arise in the interim period.  There are no Patient Instructions on file for this visit.  Electronically signed by:  Lauraine KYM Oz, MSN, FNP-C, CUNP 03/10/2024 10:37 AM

## 2024-03-10 ENCOUNTER — Ambulatory Visit (INDEPENDENT_AMBULATORY_CARE_PROVIDER_SITE_OTHER): Admitting: Urology

## 2024-03-10 ENCOUNTER — Encounter: Payer: Self-pay | Admitting: Urology

## 2024-03-10 VITALS — BP 158/119 | HR 77

## 2024-03-10 DIAGNOSIS — R3129 Other microscopic hematuria: Secondary | ICD-10-CM

## 2024-03-10 DIAGNOSIS — R361 Hematospermia: Secondary | ICD-10-CM | POA: Diagnosis not present

## 2024-03-10 DIAGNOSIS — F17210 Nicotine dependence, cigarettes, uncomplicated: Secondary | ICD-10-CM

## 2024-03-10 DIAGNOSIS — Z87438 Personal history of other diseases of male genital organs: Secondary | ICD-10-CM

## 2024-03-10 DIAGNOSIS — F17200 Nicotine dependence, unspecified, uncomplicated: Secondary | ICD-10-CM

## 2024-03-11 LAB — MICROSCOPIC EXAMINATION: Bacteria, UA: NONE SEEN

## 2024-03-11 LAB — URINALYSIS, ROUTINE W REFLEX MICROSCOPIC
Bilirubin, UA: NEGATIVE
Glucose, UA: NEGATIVE
Ketones, UA: NEGATIVE
Leukocytes,UA: NEGATIVE
Nitrite, UA: NEGATIVE
Specific Gravity, UA: 1.03 (ref 1.005–1.030)
Urobilinogen, Ur: 0.2 mg/dL (ref 0.2–1.0)
pH, UA: 6 (ref 5.0–7.5)

## 2024-03-20 ENCOUNTER — Other Ambulatory Visit (HOSPITAL_BASED_OUTPATIENT_CLINIC_OR_DEPARTMENT_OTHER): Payer: Self-pay | Admitting: Family Medicine

## 2024-03-20 NOTE — Telephone Encounter (Signed)
 Copied from CRM [id number]. Topic: Clinical - Medication Refill >> Mar 20, 2024  4:21 PM Sophia H wrote: Medication: amLODipine  (NORVASC ) 5 MG tablet   Has the patient contacted their pharmacy? Yes, told to contact office   This is the patient's preferred pharmacy:  [facility] - [place], [place] - [address] [place] [address] Phone: [phone] Fax: [phone]  Is this the correct pharmacy for this prescription? Yes If no, delete pharmacy and type the correct one.   Has the prescription been filled recently? Yes  Is the patient out of the medication? Yes  Has the patient been seen for an appointment in the last year OR does the patient have an upcoming appointment? Yes, seen June 26.  Can we respond through MyChart? Yes  Agent: Please be advised that Rx refills may take up to 3 business days. We ask that you follow-up with your pharmacy.

## 2024-04-04 ENCOUNTER — Encounter: Payer: Self-pay | Admitting: Radiology

## 2024-05-15 ENCOUNTER — Other Ambulatory Visit (HOSPITAL_BASED_OUTPATIENT_CLINIC_OR_DEPARTMENT_OTHER): Payer: Self-pay | Admitting: Family Medicine

## 2024-06-16 ENCOUNTER — Encounter: Payer: Self-pay | Admitting: Radiology

## 2024-06-18 ENCOUNTER — Encounter (HOSPITAL_BASED_OUTPATIENT_CLINIC_OR_DEPARTMENT_OTHER): Admitting: Family Medicine

## 2024-08-20 ENCOUNTER — Other Ambulatory Visit (HOSPITAL_BASED_OUTPATIENT_CLINIC_OR_DEPARTMENT_OTHER): Payer: Self-pay | Admitting: Family Medicine

## 2024-08-20 NOTE — Telephone Encounter (Signed)
 Copied from CRM #[id number]. Topic: Clinical - Medication Refill >> Aug 20, 2024 10:47 AM Carlyon D wrote: Medication: amLODipine  (NORVASC ) 5 MG tablet  Has the patient contacted their pharmacy? Yes (Agent: If no, request that the patient contact the pharmacy for the refill. If patient does not wish to contact the pharmacy document the reason why and proceed with request.) (Agent: If yes, when and what did the pharmacy advise?)  This is the patient's preferred pharmacy:  [facility] - [place], [place] - [address] [place] [address] Phone: [phone] Fax: [phone]  Is this the correct pharmacy for this prescription? Yes If no, delete pharmacy and type the correct one.   Has the prescription been filled recently? Yes  Is the patient out of the medication? Yes  Has the patient been seen for an appointment in the last year OR does the patient have an upcoming appointment? No  Can we respond through MyChart? No  Agent: Please be advised that Rx refills may take up to 3 business days. We ask that you follow-up with your pharmacy.

## 2024-09-11 ENCOUNTER — Ambulatory Visit (HOSPITAL_BASED_OUTPATIENT_CLINIC_OR_DEPARTMENT_OTHER): Admitting: Family Medicine

## 2024-09-11 ENCOUNTER — Encounter (HOSPITAL_BASED_OUTPATIENT_CLINIC_OR_DEPARTMENT_OTHER): Payer: Self-pay | Admitting: Family Medicine

## 2024-09-11 VITALS — BP 151/100 | HR 64 | Temp 97.5°F | Resp 18 | Ht 68.0 in | Wt 179.0 lb

## 2024-09-11 DIAGNOSIS — I10 Essential (primary) hypertension: Secondary | ICD-10-CM

## 2024-09-11 MED ORDER — VALSARTAN 80 MG PO TABS: 80.0000 mg | ORAL_TABLET | Freq: Every day | ORAL | 1 refills | Status: AC

## 2024-09-11 MED ORDER — AMLODIPINE BESYLATE 5 MG PO TABS: 5.0000 mg | ORAL_TABLET | Freq: Every day | ORAL | 1 refills | Status: AC

## 2024-09-11 NOTE — Assessment & Plan Note (Signed)
 Blood pressure is elevated in office today, was elevated at last visit as well as at urology appointment between last visit and today.  No current issues with chest pain or headaches.  Has continued with amlodipine , denies any issues with medication. We discussed considerations, given continued elevation of blood pressure, would recommend making change to blood pressure medication regimen.  We can proceed with adding low-dose of valsartan  to be taken with low-dose of amlodipine .  Cautioned on potential side effects. He will return for labs in 2 weeks with this new medication being started Recommend intermittent monitoring of blood pressure at home, DASH diet

## 2024-09-11 NOTE — Progress Notes (Signed)
" ° ° °  Procedures performed today:    None.  Independent interpretation of notes and tests performed by another provider:   None.  Brief History, Exam, Impression, and Recommendations:    BP (!) 151/100 (BP Location: Left Arm, Patient Position: Sitting, Cuff Size: Normal)   Pulse 64   Temp (!) 97.5 F (36.4 C) (Oral)   Resp 18   Ht 5' 8 (1.727 m)   Wt 179 lb (81.2 kg)   SpO2 100%   BMI 27.22 kg/m   Primary hypertension Assessment & Plan: Blood pressure is elevated in office today, was elevated at last visit as well as at urology appointment between last visit and today.  No current issues with chest pain or headaches.  Has continued with amlodipine , denies any issues with medication. We discussed considerations, given continued elevation of blood pressure, would recommend making change to blood pressure medication regimen.  We can proceed with adding low-dose of valsartan  to be taken with low-dose of amlodipine .  Cautioned on potential side effects. He will return for labs in 2 weeks with this new medication being started Recommend intermittent monitoring of blood pressure at home, DASH diet  Orders: -     Basic metabolic panel with GFR  Other orders -     Valsartan ; Take 1 tablet (80 mg total) by mouth daily.  Dispense: 90 tablet; Refill: 1 -     amLODIPine  Besylate; Take 1 tablet (5 mg total) by mouth daily.  Dispense: 90 tablet; Refill: 1  Return in about 2 months (around 11/09/2024) for CPE with fasting labs 1 week prior.   ___________________________________________ Lonita Debes de [place], MD, ABFM, CAQSM Primary Care and Sports Medicine [facility] "

## 2024-11-11 ENCOUNTER — Encounter (HOSPITAL_BASED_OUTPATIENT_CLINIC_OR_DEPARTMENT_OTHER): Admitting: Family Medicine
